# Patient Record
Sex: Male | Born: 1960 | Race: Black or African American | Hispanic: No | Marital: Married | Smoking: Former smoker
Health system: Southern US, Community
[De-identification: ages and names within clinical notes are randomized; demographics above are authoritative.]

## PROBLEM LIST (undated history)

## (undated) DIAGNOSIS — E119 Type 2 diabetes mellitus without complications: Secondary | ICD-10-CM

## (undated) DIAGNOSIS — S82891A Other fracture of right lower leg, initial encounter for closed fracture: Secondary | ICD-10-CM

## (undated) DIAGNOSIS — E7800 Pure hypercholesterolemia, unspecified: Secondary | ICD-10-CM

## (undated) DIAGNOSIS — I10 Essential (primary) hypertension: Secondary | ICD-10-CM

---

## 1998-12-28 ENCOUNTER — Encounter: Payer: Self-pay | Admitting: Emergency Medicine

## 1998-12-28 ENCOUNTER — Emergency Department (HOSPITAL_COMMUNITY): Admission: EM | Admit: 1998-12-28 | Discharge: 1998-12-28 | Payer: Self-pay | Admitting: Emergency Medicine

## 2004-08-10 ENCOUNTER — Ambulatory Visit: Payer: Self-pay | Admitting: Internal Medicine

## 2005-06-30 ENCOUNTER — Ambulatory Visit: Payer: Self-pay | Admitting: Internal Medicine

## 2005-08-11 ENCOUNTER — Ambulatory Visit: Payer: Self-pay | Admitting: Internal Medicine

## 2005-09-22 ENCOUNTER — Ambulatory Visit: Payer: Self-pay | Admitting: Internal Medicine

## 2006-08-03 ENCOUNTER — Encounter: Admission: RE | Admit: 2006-08-03 | Discharge: 2006-08-03 | Payer: Self-pay | Admitting: Gastroenterology

## 2007-05-21 DIAGNOSIS — I10 Essential (primary) hypertension: Secondary | ICD-10-CM | POA: Insufficient documentation

## 2013-03-15 ENCOUNTER — Other Ambulatory Visit: Payer: Self-pay | Admitting: Family Medicine

## 2013-03-15 ENCOUNTER — Ambulatory Visit
Admission: RE | Admit: 2013-03-15 | Discharge: 2013-03-15 | Disposition: A | Discharge status: Home / Self Care | Payer: BC Managed Care – PPO | Source: Ambulatory Visit | Attending: Family Medicine | Admitting: Family Medicine

## 2013-03-15 DIAGNOSIS — R05 Cough: Secondary | ICD-10-CM

## 2013-03-15 DIAGNOSIS — R059 Cough, unspecified: Secondary | ICD-10-CM

## 2013-12-14 ENCOUNTER — Encounter (HOSPITAL_COMMUNITY): Payer: Self-pay | Admitting: Emergency Medicine

## 2013-12-14 ENCOUNTER — Observation Stay (HOSPITAL_COMMUNITY)
Admission: EM | Admit: 2013-12-14 | Discharge: 2013-12-15 | Disposition: A | Discharge status: Home / Self Care | Payer: BC Managed Care – PPO | Attending: Internal Medicine | Admitting: Internal Medicine

## 2013-12-14 ENCOUNTER — Emergency Department (HOSPITAL_COMMUNITY): Payer: BC Managed Care – PPO

## 2013-12-14 DIAGNOSIS — I6529 Occlusion and stenosis of unspecified carotid artery: Secondary | ICD-10-CM | POA: Insufficient documentation

## 2013-12-14 DIAGNOSIS — R918 Other nonspecific abnormal finding of lung field: Secondary | ICD-10-CM | POA: Insufficient documentation

## 2013-12-14 DIAGNOSIS — I2510 Atherosclerotic heart disease of native coronary artery without angina pectoris: Secondary | ICD-10-CM | POA: Insufficient documentation

## 2013-12-14 DIAGNOSIS — I771 Stricture of artery: Secondary | ICD-10-CM | POA: Insufficient documentation

## 2013-12-14 DIAGNOSIS — Z87891 Personal history of nicotine dependence: Secondary | ICD-10-CM | POA: Insufficient documentation

## 2013-12-14 DIAGNOSIS — E871 Hypo-osmolality and hyponatremia: Secondary | ICD-10-CM | POA: Insufficient documentation

## 2013-12-14 DIAGNOSIS — E049 Nontoxic goiter, unspecified: Secondary | ICD-10-CM

## 2013-12-14 DIAGNOSIS — E7800 Pure hypercholesterolemia, unspecified: Secondary | ICD-10-CM | POA: Insufficient documentation

## 2013-12-14 DIAGNOSIS — R410 Disorientation, unspecified: Secondary | ICD-10-CM | POA: Diagnosis present

## 2013-12-14 DIAGNOSIS — E785 Hyperlipidemia, unspecified: Secondary | ICD-10-CM | POA: Insufficient documentation

## 2013-12-14 DIAGNOSIS — E119 Type 2 diabetes mellitus without complications: Secondary | ICD-10-CM | POA: Insufficient documentation

## 2013-12-14 DIAGNOSIS — I10 Essential (primary) hypertension: Secondary | ICD-10-CM

## 2013-12-14 DIAGNOSIS — Z79899 Other long term (current) drug therapy: Secondary | ICD-10-CM | POA: Insufficient documentation

## 2013-12-14 DIAGNOSIS — K7689 Other specified diseases of liver: Secondary | ICD-10-CM | POA: Insufficient documentation

## 2013-12-14 DIAGNOSIS — G9340 Encephalopathy, unspecified: Secondary | ICD-10-CM | POA: Insufficient documentation

## 2013-12-14 DIAGNOSIS — E876 Hypokalemia: Secondary | ICD-10-CM

## 2013-12-14 DIAGNOSIS — K219 Gastro-esophageal reflux disease without esophagitis: Secondary | ICD-10-CM | POA: Insufficient documentation

## 2013-12-14 DIAGNOSIS — Z8781 Personal history of (healed) traumatic fracture: Secondary | ICD-10-CM | POA: Insufficient documentation

## 2013-12-14 DIAGNOSIS — R4182 Altered mental status, unspecified: Secondary | ICD-10-CM

## 2013-12-14 DIAGNOSIS — R079 Chest pain, unspecified: Principal | ICD-10-CM | POA: Insufficient documentation

## 2013-12-14 DIAGNOSIS — F29 Unspecified psychosis not due to a substance or known physiological condition: Secondary | ICD-10-CM | POA: Insufficient documentation

## 2013-12-14 DIAGNOSIS — E041 Nontoxic single thyroid nodule: Secondary | ICD-10-CM | POA: Insufficient documentation

## 2013-12-14 HISTORY — DX: Essential (primary) hypertension: I10

## 2013-12-14 HISTORY — DX: Other fracture of right lower leg, initial encounter for closed fracture: S82.891A

## 2013-12-14 HISTORY — DX: Type 2 diabetes mellitus without complications: E11.9

## 2013-12-14 HISTORY — DX: Pure hypercholesterolemia, unspecified: E78.00

## 2013-12-14 LAB — I-STAT CHEM 8, ED
BUN: 7 mg/dL (ref 6–23)
CALCIUM ION: 1.09 mmol/L — AB (ref 1.12–1.23)
CREATININE: 1 mg/dL (ref 0.50–1.35)
Chloride: 85 mEq/L — ABNORMAL LOW (ref 96–112)
GLUCOSE: 197 mg/dL — AB (ref 70–99)
HCT: 45 % (ref 39.0–52.0)
Hemoglobin: 15.3 g/dL (ref 13.0–17.0)
Potassium: 3 mEq/L — ABNORMAL LOW (ref 3.7–5.3)
Sodium: 122 mEq/L — ABNORMAL LOW (ref 137–147)
TCO2: 23 mmol/L (ref 0–100)

## 2013-12-14 LAB — RAPID URINE DRUG SCREEN, HOSP PERFORMED
Amphetamines: NOT DETECTED
BARBITURATES: NOT DETECTED
BENZODIAZEPINES: NOT DETECTED
COCAINE: NOT DETECTED
Opiates: NOT DETECTED
TETRAHYDROCANNABINOL: NOT DETECTED

## 2013-12-14 LAB — COMPREHENSIVE METABOLIC PANEL
ALT: 43 U/L (ref 0–53)
AST: 42 U/L — ABNORMAL HIGH (ref 0–37)
Albumin: 4.3 g/dL (ref 3.5–5.2)
Alkaline Phosphatase: 49 U/L (ref 39–117)
BUN: 9 mg/dL (ref 6–23)
CALCIUM: 9.8 mg/dL (ref 8.4–10.5)
CO2: 22 meq/L (ref 19–32)
Chloride: 80 mEq/L — ABNORMAL LOW (ref 96–112)
Creatinine, Ser: 0.82 mg/dL (ref 0.50–1.35)
GLUCOSE: 202 mg/dL — AB (ref 70–99)
Potassium: 3.2 mEq/L — ABNORMAL LOW (ref 3.7–5.3)
Sodium: 122 mEq/L — ABNORMAL LOW (ref 137–147)
Total Bilirubin: 1 mg/dL (ref 0.3–1.2)
Total Protein: 7.3 g/dL (ref 6.0–8.3)

## 2013-12-14 LAB — URINALYSIS, ROUTINE W REFLEX MICROSCOPIC
BILIRUBIN URINE: NEGATIVE
Glucose, UA: 250 mg/dL — AB
KETONES UR: NEGATIVE mg/dL
Leukocytes, UA: NEGATIVE
Nitrite: NEGATIVE
PH: 6 (ref 5.0–8.0)
Protein, ur: NEGATIVE mg/dL
Specific Gravity, Urine: 1.01 (ref 1.005–1.030)
Urobilinogen, UA: 0.2 mg/dL (ref 0.0–1.0)

## 2013-12-14 LAB — I-STAT TROPONIN, ED: Troponin i, poc: 0 ng/mL (ref 0.00–0.08)

## 2013-12-14 LAB — CBC
HEMATOCRIT: 40.3 % (ref 39.0–52.0)
Hemoglobin: 14.2 g/dL (ref 13.0–17.0)
MCH: 30.2 pg (ref 26.0–34.0)
MCHC: 35.2 g/dL (ref 30.0–36.0)
MCV: 85.7 fL (ref 78.0–100.0)
Platelets: 261 10*3/uL (ref 150–400)
RBC: 4.7 MIL/uL (ref 4.22–5.81)
RDW: 12.3 % (ref 11.5–15.5)
WBC: 10.2 10*3/uL (ref 4.0–10.5)

## 2013-12-14 LAB — TROPONIN I: Troponin I: <0.3 ng/mL (ref <0.30)

## 2013-12-14 LAB — CBG MONITORING, ED: GLUCOSE-CAPILLARY: 181 mg/dL — AB (ref 70–99)

## 2013-12-14 LAB — PROTIME-INR
INR: 0.94 (ref 0.00–1.49)
PROTHROMBIN TIME: 12.4 s (ref 11.6–15.2)

## 2013-12-14 LAB — URINE MICROSCOPIC-ADD ON

## 2013-12-14 LAB — ETHANOL: Alcohol, Ethyl (B): 37 mg/dL — ABNORMAL HIGH (ref 0–11)

## 2013-12-14 LAB — PRO B NATRIURETIC PEPTIDE: Pro B Natriuretic peptide (BNP): 13 pg/mL (ref 0–125)

## 2013-12-14 MED ORDER — IOHEXOL 350 MG/ML SOLN
125.0000 mL | Freq: Once | INTRAVENOUS | Status: AC | PRN
  Administered 2013-12-14: 125 mL via INTRAVENOUS

## 2013-12-14 MED ORDER — MORPHINE SULFATE 2 MG/ML IJ SOLN
2.0000 mg | Freq: Once | INTRAMUSCULAR | Status: AC
  Administered 2013-12-14: 2 mg via INTRAVENOUS
  Filled 2013-12-14: qty 1

## 2013-12-14 MED ORDER — POTASSIUM CHLORIDE CRYS ER 20 MEQ PO TBCR
40.0000 meq | EXTENDED_RELEASE_TABLET | Freq: Once | ORAL | Status: AC
  Administered 2013-12-14: 40 meq via ORAL
  Filled 2013-12-14: qty 2

## 2013-12-14 MED ORDER — SODIUM CHLORIDE 0.9 % IV BOLUS (SEPSIS)
1000.0000 mL | Freq: Once | INTRAVENOUS | Status: AC
  Administered 2013-12-14: 1000 mL via INTRAVENOUS

## 2013-12-14 MED ORDER — POTASSIUM CHLORIDE 10 MEQ/100ML IV SOLN
10.0000 meq | Freq: Once | INTRAVENOUS | Status: AC
  Administered 2013-12-14: 10 meq via INTRAVENOUS
  Filled 2013-12-14: qty 100

## 2013-12-14 MED ORDER — ASPIRIN 81 MG PO CHEW
324.0000 mg | CHEWABLE_TABLET | Freq: Once | ORAL | Status: AC
  Administered 2013-12-14: 324 mg via ORAL
  Filled 2013-12-14: qty 4

## 2013-12-14 MED ORDER — NITROGLYCERIN 0.4 MG SL SUBL: 0.4000 mg | SUBLINGUAL_TABLET | SUBLINGUAL | Status: DC | PRN

## 2013-12-14 NOTE — ED Provider Notes (Signed)
CSN: [id number]     Arrival date & time 12/14/13  1946 History   None    Chief Complaint  Patient presents with  . Chest Pain  . Altered Mental Status     (Consider location/radiation/quality/duration/timing/severity/associated sxs/prior Treatment) HPI Comments: Per family, pt was at home, confused, began complaining of chest pain, was diaphoretic, fidgety, had slurred speech . They gave him some snakcs thinking his glucose was low, but pt did not improved and continued to complain of chest pain.   Patient is a 53 y.o. male presenting with altered mental status. The history is provided by the patient. The history is limited by the condition of the patient. No language interpreter was used.  Altered Mental Status Presenting symptoms: confusion   Severity:  Moderate Most recent episode:  Today Episode history:  Single Duration:  4 hours Timing:  Constant Progression:  Waxing and waning Chronicity:  New Context: alcohol use   Context: not dementia, not head injury, not a recent illness and not a recent infection   Associated symptoms: headaches, nausea and visual change   Associated symptoms: no abdominal pain, no agitation, no fever, no rash, no vomiting and no weakness   Associated symptoms comment:  Chest pain, diaphoresis, aggitation   Past Medical History  Diagnosis Date  . Diabetes mellitus without complication   . Hypertension   . High cholesterol   . Ankle fracture, right     steel plate   History reviewed. No pertinent past surgical history. No family history on file. History  Substance Use Topics  . Smoking status: Current Every Day Smoker -- 0.50 packs/day    Types: Cigarettes  . Smokeless tobacco: Never Used  . Alcohol Use: 1.8 oz/week    3 Cans of beer per week    Review of Systems  Constitutional: Positive for diaphoresis and activity change. Negative for fever, appetite change and fatigue.  HENT: Negative for congestion, facial swelling, rhinorrhea and  trouble swallowing.   Eyes: Negative for photophobia and pain.  Respiratory: Negative for cough, chest tightness and shortness of breath.   Cardiovascular: Positive for chest pain. Negative for leg swelling.  Gastrointestinal: Positive for nausea. Negative for vomiting, abdominal pain, diarrhea and constipation.  Endocrine: Negative for polydipsia and polyuria.  Genitourinary: Negative for dysuria, urgency, decreased urine volume and difficulty urinating.  Musculoskeletal: Negative for back pain and gait problem.  Skin: Negative for color change, rash and wound.  Allergic/Immunologic: Negative for immunocompromised state.  Neurological: Positive for headaches. Negative for dizziness, facial asymmetry, speech difficulty, weakness and numbness.  Psychiatric/Behavioral: Positive for confusion. Negative for decreased concentration and agitation. The patient is nervous/anxious.       Allergies  Review of patient's allergies indicates no known allergies.  Home Medications   Current Outpatient Rx  Name  Route  Sig  Dispense  Refill  . atorvastatin (LIPITOR) 10 MG tablet   Oral   Take 10 mg by mouth daily.         . benazepril-hydrochlorthiazide (LOTENSIN HCT) 10-12.5 MG per tablet   Oral   Take 1 tablet by mouth daily.         . metFORMIN (GLUCOPHAGE-XR) 500 MG 24 hr tablet   Oral   Take 1 tablet by mouth 2 (two) times daily.           BP 143/77  Pulse 101  Temp(Src) 98.2 F (36.8 C) (Oral)  Resp 18  Ht 5\' 4"  (1.626 m)  Wt 193 lb (87.544 kg)  BMI 33.11 kg/m2  SpO2 99% Physical Exam  Constitutional: He appears well-developed and well-nourished. No distress.  HENT:  Head: Normocephalic and atraumatic.  Mouth/Throat: No oropharyngeal exudate.  Eyes: Pupils are equal, round, and reactive to light.  Neck: Normal range of motion. Neck supple.  Cardiovascular: Regular rhythm and normal heart sounds.  Tachycardia present.  Exam reveals no gallop and no friction rub.   No  murmur heard. Pulmonary/Chest: Effort normal and breath sounds normal. No respiratory distress. He has no wheezes. He has no rales.  Abdominal: Soft. Bowel sounds are normal. He exhibits no distension and no mass. There is no tenderness. There is no rebound and no guarding.  Musculoskeletal: Normal range of motion. He exhibits no edema and no tenderness.  Neurological: He is alert. He has normal strength. He displays no tremor. No cranial nerve deficit or sensory deficit. He exhibits normal muscle tone. Coordination normal. GCS eye subscore is 4. GCS verbal subscore is 4. GCS motor subscore is 6.  Skin: Skin is warm and dry.  Psychiatric: His mood appears anxious. Cognition and memory are impaired.    ED Course  Procedures (including critical care time) Labs Review Labs Reviewed  COMPREHENSIVE METABOLIC PANEL - Abnormal; Notable for the following:    Sodium 122 (*)    Potassium 3.2 (*)    Chloride 80 (*)    Glucose, Bld 202 (*)    AST 42 (*)    All other components within normal limits  ETHANOL - Abnormal; Notable for the following:    Alcohol, Ethyl (B) 37 (*)    All other components within normal limits  URINALYSIS, ROUTINE W REFLEX MICROSCOPIC - Abnormal; Notable for the following:    Glucose, UA 250 (*)    Hgb urine dipstick LARGE (*)    All other components within normal limits  I-STAT CHEM 8, ED - Abnormal; Notable for the following:    Sodium 122 (*)    Potassium 3.0 (*)    Chloride 85 (*)    Glucose, Bld 197 (*)    Calcium, Ion 1.09 (*)    All other components within normal limits  CBG MONITORING, ED - Abnormal; Notable for the following:    Glucose-Capillary 181 (*)    All other components within normal limits  CBC  PRO B NATRIURETIC PEPTIDE  PROTIME-INR  TROPONIN I  URINE RAPID DRUG SCREEN (HOSP PERFORMED)  URINE MICROSCOPIC-ADD ON  TROPONIN I  TROPONIN I  TROPONIN I  TROPONIN I  I-STAT TROPOININ, ED   Imaging Review Ct Angio Head W/cm &/or Wo  Cm  12/14/2013   CLINICAL DATA:  Abnormal chest x-ray, evaluate for dissection  EXAM: CT ANGIOGRAPHY HEAD AND NECK  TECHNIQUE: Multidetector CT imaging of the head and neck was performed using the standard protocol during bolus administration of intravenous contrast. Multiplanar CT image reconstructions and MIPs were obtained to evaluate the vascular anatomy. Carotid stenosis measurements (when applicable) are obtained utilizing NASCET criteria, using the distal internal carotid diameter as the denominator.  CONTRAST:  OMNIPAQUE IOHEXOL 350 MG/ML SOLN  COMPARISON:  Prior radiograph performed earlier on the same day.  FINDINGS: CTA HEAD FINDINGS  The noncontrast images of the brain demonstrate no evidence of acute intracranial hemorrhage or large vessel territory infarct. No mass lesion or midline shift. Cerebral volume within normal limits for patient age. No extra-axial fluid collection.  Calvarium is intact. Scalp soft tissues are normal. Orbits within normal limits.  Paranasal sinuses and mastoid air cells are  clear.  Anterior circulation: Normal appearance of the distal cervical and petrous segments of the internal carotid arteries bilaterally. There is tortuosity of the cavernous portion of the right internal carotid artery without associated high-grade flow-limiting stenosis. Atherosclerotic calcifications cm within the cavernous left ICA with approximately 50% of short segment stenosis. Minimal atherosclerotic plaque noted within the cavernous right ICA without associated stenosis. The supra clinoid internal carotid arteries are within normal limits bilaterally. Widely patent anterior communicating artery. Normal appearance of the anterior and middle cerebral arteries.  Posterior circulation: Right vertebral artery is dominant, with normal appearance of the vertebral arteries, vertebrobasilar junction and basilar artery, as well as main branch vessels. Small bilateral posterior communicating arteries  are present. Normal appearance of posterior cerebral arteries.  No large vessel occlusion, hemodynamically significant stenosis, dissection, luminal irregularity, contrast extravasation or aneurysm within the anterior nor posterior circulation.  Review of the MIP images confirms the above findings.  CTA NECK FINDINGS  The examination is technically limited by motion artifact.  The visualized aortic arch is within normal limits with normal 3 vessel morphology. No stenosis seen at the origin of the great vessels. The subclavian arteries are widely patent and normal in caliber bilaterally.  The common carotid arteries are well opacified and of normal caliber without evidence of dissection or high-grade flow-limiting stenosis. Minimal atherosclerotic plaque noted about the right carotid bifurcation and proximal right internal carotid artery. The internal carotid arteries are well opacified bilaterally without evidence of dissection or high-grade flow-limiting stenosis. Tortuosity of the distal right internal carotid artery noted.  The external carotid arteries and their branches are within normal limits.  The vertebral arteries both arise from the subclavian arteries. The right vertebral artery is dominant. Vertebral arteries are well opacified along their entire course without evidence of dissection, occlusion, or other focal abnormality.  Review of the MIP images confirms the above findings.  IMPRESSION: CTA HEAD:  1. Atherosclerotic plaque with associated short segment stenosis of approximately 50% within the cavernous left internal carotid artery. 2. Otherwise unremarkable CTA of the brain without evidence of high-grade flow-limiting stenosis, proximal branch occlusion, or intracranial aneurysm.  CTA NECK:  No CT evidence of dissection or high-grade flow-limiting stenosis identified within the neck.   Electronically Signed   By: Rise Mu M.D.   On: 12/14/2013 23:40   Ct Angio Neck W/cm &/or  Wo/cm  12/14/2013   CLINICAL DATA:  Abnormal chest x-ray, evaluate for dissection  EXAM: CT ANGIOGRAPHY HEAD AND NECK  TECHNIQUE: Multidetector CT imaging of the head and neck was performed using the standard protocol during bolus administration of intravenous contrast. Multiplanar CT image reconstructions and MIPs were obtained to evaluate the vascular anatomy. Carotid stenosis measurements (when applicable) are obtained utilizing NASCET criteria, using the distal internal carotid diameter as the denominator.  CONTRAST:  OMNIPAQUE IOHEXOL 350 MG/ML SOLN  COMPARISON:  Prior radiograph performed earlier on the same day.  FINDINGS: CTA HEAD FINDINGS  The noncontrast images of the brain demonstrate no evidence of acute intracranial hemorrhage or large vessel territory infarct. No mass lesion or midline shift. Cerebral volume within normal limits for patient age. No extra-axial fluid collection.  Calvarium is intact. Scalp soft tissues are normal. Orbits within normal limits.  Paranasal sinuses and mastoid air cells are clear.  Anterior circulation: Normal appearance of the distal cervical and petrous segments of the internal carotid arteries bilaterally. There is tortuosity of the cavernous portion of the right internal carotid artery without associated high-grade  flow-limiting stenosis. Atherosclerotic calcifications cm within the cavernous left ICA with approximately 50% of short segment stenosis. Minimal atherosclerotic plaque noted within the cavernous right ICA without associated stenosis. The supra clinoid internal carotid arteries are within normal limits bilaterally. Widely patent anterior communicating artery. Normal appearance of the anterior and middle cerebral arteries.  Posterior circulation: Right vertebral artery is dominant, with normal appearance of the vertebral arteries, vertebrobasilar junction and basilar artery, as well as main branch vessels. Small bilateral posterior communicating  arteries are present. Normal appearance of posterior cerebral arteries.  No large vessel occlusion, hemodynamically significant stenosis, dissection, luminal irregularity, contrast extravasation or aneurysm within the anterior nor posterior circulation.  Review of the MIP images confirms the above findings.  CTA NECK FINDINGS  The examination is technically limited by motion artifact.  The visualized aortic arch is within normal limits with normal 3 vessel morphology. No stenosis seen at the origin of the great vessels. The subclavian arteries are widely patent and normal in caliber bilaterally.  The common carotid arteries are well opacified and of normal caliber without evidence of dissection or high-grade flow-limiting stenosis. Minimal atherosclerotic plaque noted about the right carotid bifurcation and proximal right internal carotid artery. The internal carotid arteries are well opacified bilaterally without evidence of dissection or high-grade flow-limiting stenosis. Tortuosity of the distal right internal carotid artery noted.  The external carotid arteries and their branches are within normal limits.  The vertebral arteries both arise from the subclavian arteries. The right vertebral artery is dominant. Vertebral arteries are well opacified along their entire course without evidence of dissection, occlusion, or other focal abnormality.  Review of the MIP images confirms the above findings.  IMPRESSION: CTA HEAD:  1. Atherosclerotic plaque with associated short segment stenosis of approximately 50% within the cavernous left internal carotid artery. 2. Otherwise unremarkable CTA of the brain without evidence of high-grade flow-limiting stenosis, proximal branch occlusion, or intracranial aneurysm.  CTA NECK:  No CT evidence of dissection or high-grade flow-limiting stenosis identified within the neck.   Electronically Signed   By: Rise Mu M.D.   On: 12/14/2013 23:40   Ct Chest W  Contrast  12/14/2013   CLINICAL DATA:  Unexplained chest pain. Acute mental status changes.  EXAM: CT CHEST WITH CONTRAST  TECHNIQUE: Multidetector CT imaging of the chest was performed during intravenous contrast administration.  CONTRAST:  OMNIPAQUE IOHEXOL 350 MG/ML IV. This was also administered for the CT angio head and neck which was obtained simultaneously and is reported separately.  COMPARISON:  DG CHEST 1V PORT dated 12/14/2013; DG CHEST 2 VIEW dated 03/15/2013; DG CHEST 2V dated 03/15/2011  FINDINGS: Examination degraded by respiratory motion throughout. Normal heart size. Mild to moderate right coronary atherosclerosis. No pericardial effusion. No visible atherosclerosis involving the thoracic or upper abdominal aorta or their visualized branches.  Pulmonary parenchyma clear without localized airspace consolidation, interstitial disease, or parenchymal nodules or masses. Central airways patent without significant bronchial wall thickening.  Thickening of the wall of the distal esophagus without evidence of hiatal hernia. No significant mediastinal, hilar, or axillary lymphadenopathy. Diffuse thyroid gland enlargement with an approximate 1.8 x 1.5 cm nodule in the lower pole of the left lobe of the gland.  Focal hepatic steatosis adjacent to the falciform ligament. Stomach mildly distended with fluid. Visualized upper abdomen otherwise unremarkable. Bone window images unremarkable.  IMPRESSION: 1. Respiratory motion degraded examination demonstrates thickening of the wall of the distal esophagus without evidence of hiatal hernia, coronary esophagitis  and/or the sequela of GE reflux disease. 2. Mild-to-moderate right coronary artery atherosclerosis. 3.  No acute cardiopulmonary disease. 4. Goiter with an approximate 1.8 x 1.5 cm nodule in the lower pole of the left lobe of the thyroid gland. Non-emergent palpation thyroid ultrasound is indicated. This follows ACR consensus guidelines: Managing  Incidental Thyroid Nodules Detected on Imaging: White Paper of the ACR Incidental Thyroid Findings Committee. J Am Coll Radiol 2015; 12:143-150.   Electronically Signed   By: Hulan Saashomas  Lawrence M.D.   On: 12/14/2013 23:28   Dg Chest Portable 1 View  12/14/2013   CLINICAL DATA:  53 year old male with chest pain.  EXAM: PORTABLE CHEST - 1 VIEW  COMPARISON:  03/23/2013  FINDINGS: Rounded prominence of the right suprahilar mediastinum is noted.  This is a low volume film.  Mild peribronchial thickening again identified.  There is no evidence of focal airspace disease, pulmonary edema, pleural effusion, or pneumothorax. No acute bony abnormalities are identified.  IMPRESSION: Right mediastinal prominence. Consider PA and lateral chest radiographs for initial evaluation versus chest CT with contrast.  No other significant abnormalities identified.   Electronically Signed   By: Laveda AbbeJeff  Hu M.D.   On: 12/14/2013 20:40     EKG Interpretation None      Date: 12/15/2013 @ 19:52  Rate: 115  Rhythm: sinus tachycardia  QRS Axis: normal  Intervals: normal  ST/T Wave abnormalities: nonspecific ST changes,  Conduction Disutrbances:none  Narrative Interpretation: upsloping ST segments in inferior and precordial leads w/o reciprocal changes.   Old EKG Reviewed: none available   Date: 12/15/2013 @ 19:56  Rate: 115  Rhythm: sinus tachycardia  QRS Axis: normal  Intervals: normal  ST/T Wave abnormalities: nonspecific ST changes,   Conduction Disutrbances:none  Narrative Interpretation: upsloping ST segments in inferior and precordial leads w/o reciprocal changes.   Old EKG Reviewed: No changes   Date: 12/15/2013 @ 21:38  Rate: 105  Rhythm: sinus tachycardia  QRS Axis: normal  Intervals: normal  ST/T Wave abnormalities: nonspecific ST changes,   Conduction Disutrbances:none  Narrative Interpretation: upsloping ST segments in inferior and precordial leads w/o reciprocal changes.   Old EKG Reviewed: No  changes    MDM   Final diagnoses:  Chest pain  Altered mental status  Goiter  Hyponatremia  Hypokalemia    Pt is a 53 y.o. male with Pmhx as above who presents with CP, SOB, nausea, confusion, diaphoresis beginning around 4pm today.  CP had resolved just prior to by exam.  Pt is confused, states he feels strange, but has difficulty describing. He denies drug use, states he drank 4-5 beers today which he does not normally do. He denies fever, cough, ab pain, vomiting, diarrhea.  On PE, pt is tachycardic, VS otherwise stable. He has reproducible chest wall pain. Cardiopulm exam otherwise benign. GCS 14 (cannot give month and has frequent confusion w/ questioning), but no focal neuro findings.  EKG w/ upsloping ST segments in inferior and precordial leads, but no elevation, no reciprocal changes. I spoke w/ Dr. Excell Seltzerooper of cardiology who does not believe he meets STEMI criteria, rec repeat EKGs to look for dynamic changes.     Chest pain returned, but pt had no EKG changes. CXR w/ abnl R mediastinal border.  Given the combination of chest pain, AMS, and abnormal mediastinum on CXR Dissection study ordered.  Na 122,  K 3.2 which I suspect is due to chronic alcohol abuse, though pt denies.    12:02 AM No acute findings  on CTs, though does have atherosclerotic plaque with associated short segment stenosis of approximately 50% within the cavernous left internal carotid artery, mild-to-moderate right coronary artery atherosclerosis, and goiter.  Delta trop negative. Triage will admit.        Shanna Cisco, MD 12/15/13 [phone]

## 2013-12-14 NOTE — ED Notes (Signed)
Pt's wife states that he called her at noon today asking her to come home. Pt was confused. Later, around 1800, the pt began c/o heartburn and chest pain. Pt is confused upon examination and unable to clearly answer questions. Pt is also anxious. Pt did admit to drinking 4-5 beers today and states this is not normal for him.

## 2013-12-15 DIAGNOSIS — F29 Unspecified psychosis not due to a substance or known physiological condition: Secondary | ICD-10-CM

## 2013-12-15 DIAGNOSIS — I10 Essential (primary) hypertension: Secondary | ICD-10-CM

## 2013-12-15 DIAGNOSIS — R410 Disorientation, unspecified: Secondary | ICD-10-CM | POA: Diagnosis present

## 2013-12-15 DIAGNOSIS — R4182 Altered mental status, unspecified: Secondary | ICD-10-CM

## 2013-12-15 DIAGNOSIS — E871 Hypo-osmolality and hyponatremia: Secondary | ICD-10-CM | POA: Diagnosis present

## 2013-12-15 DIAGNOSIS — R079 Chest pain, unspecified: Secondary | ICD-10-CM | POA: Diagnosis present

## 2013-12-15 LAB — NA AND K (SODIUM & POTASSIUM), RAND UR
POTASSIUM UR: 10 meq/L
SODIUM UR: 23 meq/L

## 2013-12-15 LAB — BASIC METABOLIC PANEL
BUN: 8 mg/dL (ref 6–23)
CO2: 27 mEq/L (ref 19–32)
Calcium: 9.4 mg/dL (ref 8.4–10.5)
Chloride: 102 mEq/L (ref 96–112)
Creatinine, Ser: 0.83 mg/dL (ref 0.50–1.35)
GFR calc non Af Amer: >90 mL/min (ref >90)
Glucose, Bld: 149 mg/dL — ABNORMAL HIGH (ref 70–99)
POTASSIUM: 4.4 meq/L (ref 3.7–5.3)
SODIUM: 140 meq/L (ref 137–147)

## 2013-12-15 LAB — TROPONIN I: Troponin I: <0.3 ng/mL (ref <0.30)

## 2013-12-15 LAB — GLUCOSE, CAPILLARY: GLUCOSE-CAPILLARY: 216 mg/dL — AB (ref 70–99)

## 2013-12-15 MED ORDER — PANTOPRAZOLE SODIUM 40 MG PO TBEC: 40.0000 mg | DELAYED_RELEASE_TABLET | Freq: Every day | ORAL | Status: AC

## 2013-12-15 MED ORDER — ONDANSETRON HCL 4 MG/2ML IJ SOLN: 4.0000 mg | Freq: Four times a day (QID) | INTRAMUSCULAR | Status: DC | PRN

## 2013-12-15 MED ORDER — HEPARIN SODIUM (PORCINE) 5000 UNIT/ML IJ SOLN
5000.0000 [IU] | Freq: Three times a day (TID) | INTRAMUSCULAR | Status: DC
  Administered 2013-12-15: 5000 [IU] via SUBCUTANEOUS
  Filled 2013-12-15 (×4): qty 1

## 2013-12-15 MED ORDER — METFORMIN HCL ER 500 MG PO TB24
500.0000 mg | ORAL_TABLET | Freq: Two times a day (BID) | ORAL | Status: DC
  Filled 2013-12-15 (×3): qty 1

## 2013-12-15 MED ORDER — ASPIRIN EC 325 MG PO TBEC: 325.0000 mg | DELAYED_RELEASE_TABLET | Freq: Every day | ORAL | Status: AC

## 2013-12-15 MED ORDER — SODIUM CHLORIDE 0.9 % IV SOLN
INTRAVENOUS | Status: AC
  Administered 2013-12-15: 02:00:00 via INTRAVENOUS

## 2013-12-15 MED ORDER — ATORVASTATIN CALCIUM 10 MG PO TABS
10.0000 mg | ORAL_TABLET | Freq: Every day | ORAL | Status: DC
  Filled 2013-12-15: qty 1

## 2013-12-15 MED ORDER — MORPHINE SULFATE 4 MG/ML IJ SOLN
4.0000 mg | Freq: Once | INTRAMUSCULAR | Status: AC
  Administered 2013-12-15: 4 mg via INTRAVENOUS
  Filled 2013-12-15: qty 1

## 2013-12-15 MED ORDER — ALUM & MAG HYDROXIDE-SIMETH 200-200-20 MG/5ML PO SUSP
30.0000 mL | Freq: Once | ORAL | Status: AC
  Administered 2013-12-15: 30 mL via ORAL
  Filled 2013-12-15: qty 30

## 2013-12-15 MED ORDER — BENAZEPRIL HCL 10 MG PO TABS
10.0000 mg | ORAL_TABLET | Freq: Every day | ORAL | Status: DC
  Filled 2013-12-15: qty 1

## 2013-12-15 MED ORDER — BENAZEPRIL HCL 20 MG PO TABS: 20.0000 mg | ORAL_TABLET | Freq: Every day | ORAL | Status: AC

## 2013-12-15 MED ORDER — ACETAMINOPHEN 325 MG PO TABS: 650.0000 mg | ORAL_TABLET | ORAL | Status: DC | PRN

## 2013-12-15 NOTE — Progress Notes (Signed)
53 year old black male with a history of hypertension, dyslipidemia, diabetes and tobacco use admitted earlier today by Dr. Julian ReilGardner for altered mental status, chest pain. Currently significantly better, mental status back to usual baseline, no further chest pain since admission. Is requesting that he be discharged home. Have offered a stress test to be done when the patient is in the hospital, however he declines and wants to pursue this in the outpatient setting. Both he and his spouse claimed that he will followup with her primary patient of Dr. Merri Brunetteandace Smith for further outpatient workup including a stress test. Case was briefly discussed on the phone with cardiology on call Dr. Teressa LowerBensimohn who fell that the patient could undergo outpatient stress test amount he also reviewed the CT scan of the chest done earlier today and thought that there was calcifications on the right coronary artery rather than atherosclerotic lesions. Patient is being discharged home in a stable condition.

## 2013-12-15 NOTE — H&P (Signed)
[facility] History and Physical  Christopher Tyler ZOX:[id number] DOB: 1961-04-29 DOA: 12/14/2013  Referring physician: EDP PCP: No primary provider on file.   Chief Complaint: Chest pain   HPI: Christopher Tyler is a 53 y.o. male who presents to the ED with CP and AMS per family.  CP onset at about 4pm this afternoon.  Located in L chest, no radiation, associated with diaphoresis.  His AMS has also been going on throughout the afternoon.  Patient admits to drinking a 6 pack of beer earlier today which is not normal for him.  When family saw the patient this afternoon, because he had slurred speech they tried giving him some snacks thinking his BGL was low, this did not help.  They also made him drink   Review of Systems: Systems reviewed.  As above, otherwise negative  Past Medical History  Diagnosis Date  . Diabetes mellitus without complication   . Hypertension   . High cholesterol   . Ankle fracture, right     steel plate   History reviewed. No pertinent past surgical history. Social History:  reports that he has been smoking Cigarettes.  He has been smoking about 0.50 packs per day. He has never used smokeless tobacco. He reports that he drinks about 1.8 ounces of alcohol per week. He reports that he does not use illicit drugs.  No Known Allergies  No family history on file.   Prior to Admission medications   Medication Sig Start Date End Date Taking? Authorizing Provider  atorvastatin (LIPITOR) 10 MG tablet Take 10 mg by mouth daily.   Yes Historical Provider, MD  benazepril-hydrochlorthiazide (LOTENSIN HCT) 10-12.5 MG per tablet Take 1 tablet by mouth daily. 11/27/13  Yes Historical Provider, MD  metFORMIN (GLUCOPHAGE-XR) 500 MG 24 hr tablet Take 1 tablet by mouth 2 (two) times daily.  11/27/13  Yes Historical Provider, MD   Physical Exam: Filed Vitals:   12/14/13 2357  BP: 143/77  Pulse: 101  Temp: 98.2 F (36.8 C)  Resp: 18    BP 143/77  Pulse 101  Temp(Src) 98.2 F  (36.8 C) (Oral)  Resp 18  Ht 5\' 4"  (1.626 m)  Wt 87.544 kg (193 lb)  BMI 33.11 kg/m2  SpO2 99%  General Appearance:    Alert, oriented, no distress, appears stated age  Head:    Normocephalic, atraumatic  Eyes:    PERRL, EOMI, sclera non-icteric        Nose:   Nares without drainage or epistaxis. Mucosa, turbinates normal  Throat:   Moist mucous membranes. Oropharynx without erythema or exudate.  Neck:   Supple. No carotid bruits.  No thyromegaly.  No lymphadenopathy.   Back:     No CVA tenderness, no spinal tenderness  Lungs:     Clear to auscultation bilaterally, without wheezes, rhonchi or rales  Chest wall:    No tenderness to palpitation  Heart:    Regular rate and rhythm without murmurs, gallops, rubs  Abdomen:     Soft, non-tender, nondistended, normal bowel sounds, no organomegaly  Genitalia:    deferred  Rectal:    deferred  Extremities:   No clubbing, cyanosis or edema.  Pulses:   2+ and symmetric all extremities  Skin:   Skin color, texture, turgor normal, no rashes or lesions  Lymph nodes:   Cervical, supraclavicular, and axillary nodes normal  Neurologic:   CNII-XII intact. Normal strength, sensation and reflexes      throughout    Labs on Admission:  Basic Metabolic Panel:  Recent Labs Lab 12/14/13 2000 12/14/13 2006  NA 122* 122*  K 3.2* 3.0*  CL 80* 85*  CO2 22  --   GLUCOSE 202* 197*  BUN 9 7  CREATININE 0.82 1.00  CALCIUM 9.8  --    Liver Function Tests:  Recent Labs Lab 12/14/13 2000  AST 42*  ALT 43  ALKPHOS 49  BILITOT 1.0  PROT 7.3  ALBUMIN 4.3   No results found for this basename: LIPASE, AMYLASE,  in the last 168 hours No results found for this basename: AMMONIA,  in the last 168 hours CBC:  Recent Labs Lab 12/14/13 2000 12/14/13 2006  WBC 10.2  --   HGB 14.2 15.3  HCT 40.3 45.0  MCV 85.7  --   PLT 261  --    Cardiac Enzymes:  Recent Labs Lab 12/14/13 2000 12/14/13 2345  TROPONINI <0.30 <0.30    BNP (last 3  results)  Recent Labs  12/14/13 2000  PROBNP 13.0   CBG:  Recent Labs Lab 12/14/13 2006  GLUCAP 181*    Radiological Exams on Admission: Ct Angio Head W/cm &/or Wo Cm  12/14/2013   CLINICAL DATA:  Abnormal chest x-ray, evaluate for dissection  EXAM: CT ANGIOGRAPHY HEAD AND NECK  TECHNIQUE: Multidetector CT imaging of the head and neck was performed using the standard protocol during bolus administration of intravenous contrast. Multiplanar CT image reconstructions and MIPs were obtained to evaluate the vascular anatomy. Carotid stenosis measurements (when applicable) are obtained utilizing NASCET criteria, using the distal internal carotid diameter as the denominator.  CONTRAST:  OMNIPAQUE IOHEXOL 350 MG/ML SOLN  COMPARISON:  Prior radiograph performed earlier on the same day.  FINDINGS: CTA HEAD FINDINGS  The noncontrast images of the brain demonstrate no evidence of acute intracranial hemorrhage or large vessel territory infarct. No mass lesion or midline shift. Cerebral volume within normal limits for patient age. No extra-axial fluid collection.  Calvarium is intact. Scalp soft tissues are normal. Orbits within normal limits.  Paranasal sinuses and mastoid air cells are clear.  Anterior circulation: Normal appearance of the distal cervical and petrous segments of the internal carotid arteries bilaterally. There is tortuosity of the cavernous portion of the right internal carotid artery without associated high-grade flow-limiting stenosis. Atherosclerotic calcifications cm within the cavernous left ICA with approximately 50% of short segment stenosis. Minimal atherosclerotic plaque noted within the cavernous right ICA without associated stenosis. The supra clinoid internal carotid arteries are within normal limits bilaterally. Widely patent anterior communicating artery. Normal appearance of the anterior and middle cerebral arteries.  Posterior circulation: Right vertebral artery is  dominant, with normal appearance of the vertebral arteries, vertebrobasilar junction and basilar artery, as well as main branch vessels. Small bilateral posterior communicating arteries are present. Normal appearance of posterior cerebral arteries.  No large vessel occlusion, hemodynamically significant stenosis, dissection, luminal irregularity, contrast extravasation or aneurysm within the anterior nor posterior circulation.  Review of the MIP images confirms the above findings.  CTA NECK FINDINGS  The examination is technically limited by motion artifact.  The visualized aortic arch is within normal limits with normal 3 vessel morphology. No stenosis seen at the origin of the great vessels. The subclavian arteries are widely patent and normal in caliber bilaterally.  The common carotid arteries are well opacified and of normal caliber without evidence of dissection or high-grade flow-limiting stenosis. Minimal atherosclerotic plaque noted about the right carotid bifurcation and proximal right internal carotid artery. The  internal carotid arteries are well opacified bilaterally without evidence of dissection or high-grade flow-limiting stenosis. Tortuosity of the distal right internal carotid artery noted.  The external carotid arteries and their branches are within normal limits.  The vertebral arteries both arise from the subclavian arteries. The right vertebral artery is dominant. Vertebral arteries are well opacified along their entire course without evidence of dissection, occlusion, or other focal abnormality.  Review of the MIP images confirms the above findings.  IMPRESSION: CTA HEAD:  1. Atherosclerotic plaque with associated short segment stenosis of approximately 50% within the cavernous left internal carotid artery. 2. Otherwise unremarkable CTA of the brain without evidence of high-grade flow-limiting stenosis, proximal branch occlusion, or intracranial aneurysm.  CTA NECK:  No CT evidence of  dissection or high-grade flow-limiting stenosis identified within the neck.   Electronically Signed   By: Rise Mu M.D.   On: 12/14/2013 23:40   Ct Angio Neck W/cm &/or Wo/cm  12/14/2013   CLINICAL DATA:  Abnormal chest x-ray, evaluate for dissection  EXAM: CT ANGIOGRAPHY HEAD AND NECK  TECHNIQUE: Multidetector CT imaging of the head and neck was performed using the standard protocol during bolus administration of intravenous contrast. Multiplanar CT image reconstructions and MIPs were obtained to evaluate the vascular anatomy. Carotid stenosis measurements (when applicable) are obtained utilizing NASCET criteria, using the distal internal carotid diameter as the denominator.  CONTRAST:  OMNIPAQUE IOHEXOL 350 MG/ML SOLN  COMPARISON:  Prior radiograph performed earlier on the same day.  FINDINGS: CTA HEAD FINDINGS  The noncontrast images of the brain demonstrate no evidence of acute intracranial hemorrhage or large vessel territory infarct. No mass lesion or midline shift. Cerebral volume within normal limits for patient age. No extra-axial fluid collection.  Calvarium is intact. Scalp soft tissues are normal. Orbits within normal limits.  Paranasal sinuses and mastoid air cells are clear.  Anterior circulation: Normal appearance of the distal cervical and petrous segments of the internal carotid arteries bilaterally. There is tortuosity of the cavernous portion of the right internal carotid artery without associated high-grade flow-limiting stenosis. Atherosclerotic calcifications cm within the cavernous left ICA with approximately 50% of short segment stenosis. Minimal atherosclerotic plaque noted within the cavernous right ICA without associated stenosis. The supra clinoid internal carotid arteries are within normal limits bilaterally. Widely patent anterior communicating artery. Normal appearance of the anterior and middle cerebral arteries.  Posterior circulation: Right vertebral artery is  dominant, with normal appearance of the vertebral arteries, vertebrobasilar junction and basilar artery, as well as main branch vessels. Small bilateral posterior communicating arteries are present. Normal appearance of posterior cerebral arteries.  No large vessel occlusion, hemodynamically significant stenosis, dissection, luminal irregularity, contrast extravasation or aneurysm within the anterior nor posterior circulation.  Review of the MIP images confirms the above findings.  CTA NECK FINDINGS  The examination is technically limited by motion artifact.  The visualized aortic arch is within normal limits with normal 3 vessel morphology. No stenosis seen at the origin of the great vessels. The subclavian arteries are widely patent and normal in caliber bilaterally.  The common carotid arteries are well opacified and of normal caliber without evidence of dissection or high-grade flow-limiting stenosis. Minimal atherosclerotic plaque noted about the right carotid bifurcation and proximal right internal carotid artery. The internal carotid arteries are well opacified bilaterally without evidence of dissection or high-grade flow-limiting stenosis. Tortuosity of the distal right internal carotid artery noted.  The external carotid arteries and their branches are within normal  limits.  The vertebral arteries both arise from the subclavian arteries. The right vertebral artery is dominant. Vertebral arteries are well opacified along their entire course without evidence of dissection, occlusion, or other focal abnormality.  Review of the MIP images confirms the above findings.  IMPRESSION: CTA HEAD:  1. Atherosclerotic plaque with associated short segment stenosis of approximately 50% within the cavernous left internal carotid artery. 2. Otherwise unremarkable CTA of the brain without evidence of high-grade flow-limiting stenosis, proximal branch occlusion, or intracranial aneurysm.  CTA NECK:  No CT evidence of  dissection or high-grade flow-limiting stenosis identified within the neck.   Electronically Signed   By: Rise Mu M.D.   On: 12/14/2013 23:40   Ct Chest W Contrast  12/14/2013   CLINICAL DATA:  Unexplained chest pain. Acute mental status changes.  EXAM: CT CHEST WITH CONTRAST  TECHNIQUE: Multidetector CT imaging of the chest was performed during intravenous contrast administration.  CONTRAST:  OMNIPAQUE IOHEXOL 350 MG/ML IV. This was also administered for the CT angio head and neck which was obtained simultaneously and is reported separately.  COMPARISON:  DG CHEST 1V PORT dated 12/14/2013; DG CHEST 2 VIEW dated 03/15/2013; DG CHEST 2V dated 03/15/2011  FINDINGS: Examination degraded by respiratory motion throughout. Normal heart size. Mild to moderate right coronary atherosclerosis. No pericardial effusion. No visible atherosclerosis involving the thoracic or upper abdominal aorta or their visualized branches.  Pulmonary parenchyma clear without localized airspace consolidation, interstitial disease, or parenchymal nodules or masses. Central airways patent without significant bronchial wall thickening.  Thickening of the wall of the distal esophagus without evidence of hiatal hernia. No significant mediastinal, hilar, or axillary lymphadenopathy. Diffuse thyroid gland enlargement with an approximate 1.8 x 1.5 cm nodule in the lower pole of the left lobe of the gland.  Focal hepatic steatosis adjacent to the falciform ligament. Stomach mildly distended with fluid. Visualized upper abdomen otherwise unremarkable. Bone window images unremarkable.  IMPRESSION: 1. Respiratory motion degraded examination demonstrates thickening of the wall of the distal esophagus without evidence of hiatal hernia, coronary esophagitis and/or the sequela of GE reflux disease. 2. Mild-to-moderate right coronary artery atherosclerosis. 3.  No acute cardiopulmonary disease. 4. Goiter with an approximate 1.8 x 1.5 cm nodule  in the lower pole of the left lobe of the thyroid gland. Non-emergent palpation thyroid ultrasound is indicated. This follows ACR consensus guidelines: Managing Incidental Thyroid Nodules Detected on Imaging: White Paper of the ACR Incidental Thyroid Findings Committee. J Am Coll Radiol 2015; 12:143-150.   Electronically Signed   By: Hulan Saas M.D.   On: 12/14/2013 23:28   Dg Chest Portable 1 View  12/14/2013   CLINICAL DATA:  53 year old male with chest pain.  EXAM: PORTABLE CHEST - 1 VIEW  COMPARISON:  03/23/2013  FINDINGS: Rounded prominence of the right suprahilar mediastinum is noted.  This is a low volume film.  Mild peribronchial thickening again identified.  There is no evidence of focal airspace disease, pulmonary edema, pleural effusion, or pneumothorax. No acute bony abnormalities are identified.  IMPRESSION: Right mediastinal prominence. Consider PA and lateral chest radiographs for initial evaluation versus chest CT with contrast.  No other significant abnormalities identified.   Electronically Signed   By: Laveda Abbe M.D.   On: 12/14/2013 20:40    EKG: Independently reviewed.  Assessment/Plan Active Problems:   Chest pain   Hyponatremia   Confusion   1. Chest pain - EKG was  Reviewed by EDP with cardiology, cardiology does not  believe that this represents STEMI at this time.  Putting patient in for obs, cycle enzymes. 2. Hyponatremia - suspicious that this is due to water intoxication, patient had 6 beers earlier today, and family thinking he was dehydrated report that they had him drink at least 5 glasses of water and a glass of orange juice.  His urine in the urinal at bedside is nearly clear, suggesting that his kidneys are diuresing the patient appropriately.  Will also hold his HCTZ he takes for chronic HTN.  Urine lytes pending, recheck BMP in AM. 3. Confusion - most likely due to hyponatremia, CT head negative, no focal neurologic findings at this time, observe for  improvement.    Code Status: Full  Family Communication: Family at bedside Disposition Plan: Admit to obs   Time spent: 70 min  GARDNER, JARED M. [facility] Pager [phone]  If 7AM-7PM, please contact the day team taking care of the patient [email or web address] Password [email or web address] 12/15/2013, 12:39 AM

## 2013-12-15 NOTE — Discharge Summary (Addendum)
PATIENT DETAILS Name: Christopher Tyler Age: 53 y.o. Sex: male Date of Birth: [birth year] MRN: [id number]. Admit Date: 12/14/2013 Admitting Physician: Hillary Bow, DO PCP:No primary provider on file.  Recommendations for Outpatient Follow-up:  1. Outpatient cardiac stress test 2. Counseled regarding the importance of stopping tobacco use 3. Counseled regarding stopping alcohol use  PRIMARY DISCHARGE DIAGNOSIS:  Active Problems:   Chest pain   Hyponatremia   Confusion      PAST MEDICAL HISTORY: Past Medical History  Diagnosis Date  . Diabetes mellitus without complication   . Hypertension   . High cholesterol   . Ankle fracture, right     steel plate    DISCHARGE MEDICATIONS:   Medication List    STOP taking these medications       benazepril-hydrochlorthiazide 10-12.5 MG per tablet  Commonly known as:  LOTENSIN HCT      TAKE these medications       aspirin EC 325 MG tablet  Take 1 tablet (325 mg total) by mouth daily.     atorvastatin 10 MG tablet  Commonly known as:  LIPITOR  Take 10 mg by mouth daily.     benazepril 20 MG tablet  Commonly known as:  LOTENSIN  Take 1 tablet (20 mg total) by mouth daily.     metFORMIN 500 MG 24 hr tablet  Commonly known as:  GLUCOPHAGE-XR  Take 1 tablet by mouth 2 (two) times daily.     pantoprazole 40 MG tablet  Commonly known as:  PROTONIX  Take 1 tablet (40 mg total) by mouth daily. Switch for any other PPI at similar dose and frequency        ALLERGIES:  No Known Allergies  BRIEF HPI:  See H&P, Labs, Consult and Test reports for all details in brief, patient was admitted for chest pain and confusion. For further details please see the history and physical dictated on admission  CONSULTATIONS:   None  PERTINENT RADIOLOGIC STUDIES: Ct Angio Head W/cm &/or Wo Cm  12/14/2013   CLINICAL DATA:  Abnormal chest x-ray, evaluate for dissection  EXAM: CT ANGIOGRAPHY HEAD AND NECK  TECHNIQUE: Multidetector CT imaging  of the head and neck was performed using the standard protocol during bolus administration of intravenous contrast. Multiplanar CT image reconstructions and MIPs were obtained to evaluate the vascular anatomy. Carotid stenosis measurements (when applicable) are obtained utilizing NASCET criteria, using the distal internal carotid diameter as the denominator.  CONTRAST:  OMNIPAQUE IOHEXOL 350 MG/ML SOLN  COMPARISON:  Prior radiograph performed earlier on the same day.  FINDINGS: CTA HEAD FINDINGS  The noncontrast images of the brain demonstrate no evidence of acute intracranial hemorrhage or large vessel territory infarct. No mass lesion or midline shift. Cerebral volume within normal limits for patient age. No extra-axial fluid collection.  Calvarium is intact. Scalp soft tissues are normal. Orbits within normal limits.  Paranasal sinuses and mastoid air cells are clear.  Anterior circulation: Normal appearance of the distal cervical and petrous segments of the internal carotid arteries bilaterally. There is tortuosity of the cavernous portion of the right internal carotid artery without associated high-grade flow-limiting stenosis. Atherosclerotic calcifications cm within the cavernous left ICA with approximately 50% of short segment stenosis. Minimal atherosclerotic plaque noted within the cavernous right ICA without associated stenosis. The supra clinoid internal carotid arteries are within normal limits bilaterally. Widely patent anterior communicating artery. Normal appearance of the anterior and middle cerebral arteries.  Posterior circulation: Right vertebral artery  is dominant, with normal appearance of the vertebral arteries, vertebrobasilar junction and basilar artery, as well as main branch vessels. Small bilateral posterior communicating arteries are present. Normal appearance of posterior cerebral arteries.  No large vessel occlusion, hemodynamically significant stenosis, dissection, luminal  irregularity, contrast extravasation or aneurysm within the anterior nor posterior circulation.  Review of the MIP images confirms the above findings.  CTA NECK FINDINGS  The examination is technically limited by motion artifact.  The visualized aortic arch is within normal limits with normal 3 vessel morphology. No stenosis seen at the origin of the great vessels. The subclavian arteries are widely patent and normal in caliber bilaterally.  The common carotid arteries are well opacified and of normal caliber without evidence of dissection or high-grade flow-limiting stenosis. Minimal atherosclerotic plaque noted about the right carotid bifurcation and proximal right internal carotid artery. The internal carotid arteries are well opacified bilaterally without evidence of dissection or high-grade flow-limiting stenosis. Tortuosity of the distal right internal carotid artery noted.  The external carotid arteries and their branches are within normal limits.  The vertebral arteries both arise from the subclavian arteries. The right vertebral artery is dominant. Vertebral arteries are well opacified along their entire course without evidence of dissection, occlusion, or other focal abnormality.  Review of the MIP images confirms the above findings.  IMPRESSION: CTA HEAD:  1. Atherosclerotic plaque with associated short segment stenosis of approximately 50% within the cavernous left internal carotid artery. 2. Otherwise unremarkable CTA of the brain without evidence of high-grade flow-limiting stenosis, proximal branch occlusion, or intracranial aneurysm.  CTA NECK:  No CT evidence of dissection or high-grade flow-limiting stenosis identified within the neck.   Electronically Signed   By: Rise Mu M.D.   On: 12/14/2013 23:40   Ct Angio Neck W/cm &/or Wo/cm  12/14/2013   CLINICAL DATA:  Abnormal chest x-ray, evaluate for dissection  EXAM: CT ANGIOGRAPHY HEAD AND NECK  TECHNIQUE: Multidetector CT imaging of  the head and neck was performed using the standard protocol during bolus administration of intravenous contrast. Multiplanar CT image reconstructions and MIPs were obtained to evaluate the vascular anatomy. Carotid stenosis measurements (when applicable) are obtained utilizing NASCET criteria, using the distal internal carotid diameter as the denominator.  CONTRAST:  OMNIPAQUE IOHEXOL 350 MG/ML SOLN  COMPARISON:  Prior radiograph performed earlier on the same day.  FINDINGS: CTA HEAD FINDINGS  The noncontrast images of the brain demonstrate no evidence of acute intracranial hemorrhage or large vessel territory infarct. No mass lesion or midline shift. Cerebral volume within normal limits for patient age. No extra-axial fluid collection.  Calvarium is intact. Scalp soft tissues are normal. Orbits within normal limits.  Paranasal sinuses and mastoid air cells are clear.  Anterior circulation: Normal appearance of the distal cervical and petrous segments of the internal carotid arteries bilaterally. There is tortuosity of the cavernous portion of the right internal carotid artery without associated high-grade flow-limiting stenosis. Atherosclerotic calcifications cm within the cavernous left ICA with approximately 50% of short segment stenosis. Minimal atherosclerotic plaque noted within the cavernous right ICA without associated stenosis. The supra clinoid internal carotid arteries are within normal limits bilaterally. Widely patent anterior communicating artery. Normal appearance of the anterior and middle cerebral arteries.  Posterior circulation: Right vertebral artery is dominant, with normal appearance of the vertebral arteries, vertebrobasilar junction and basilar artery, as well as main branch vessels. Small bilateral posterior communicating arteries are present. Normal appearance of posterior cerebral arteries.  No  large vessel occlusion, hemodynamically significant stenosis, dissection, luminal  irregularity, contrast extravasation or aneurysm within the anterior nor posterior circulation.  Review of the MIP images confirms the above findings.  CTA NECK FINDINGS  The examination is technically limited by motion artifact.  The visualized aortic arch is within normal limits with normal 3 vessel morphology. No stenosis seen at the origin of the great vessels. The subclavian arteries are widely patent and normal in caliber bilaterally.  The common carotid arteries are well opacified and of normal caliber without evidence of dissection or high-grade flow-limiting stenosis. Minimal atherosclerotic plaque noted about the right carotid bifurcation and proximal right internal carotid artery. The internal carotid arteries are well opacified bilaterally without evidence of dissection or high-grade flow-limiting stenosis. Tortuosity of the distal right internal carotid artery noted.  The external carotid arteries and their branches are within normal limits.  The vertebral arteries both arise from the subclavian arteries. The right vertebral artery is dominant. Vertebral arteries are well opacified along their entire course without evidence of dissection, occlusion, or other focal abnormality.  Review of the MIP images confirms the above findings.  IMPRESSION: CTA HEAD:  1. Atherosclerotic plaque with associated short segment stenosis of approximately 50% within the cavernous left internal carotid artery. 2. Otherwise unremarkable CTA of the brain without evidence of high-grade flow-limiting stenosis, proximal branch occlusion, or intracranial aneurysm.  CTA NECK:  No CT evidence of dissection or high-grade flow-limiting stenosis identified within the neck.   Electronically Signed   By: Rise Mu M.D.   On: 12/14/2013 23:40   Ct Chest W Contrast  12/14/2013   CLINICAL DATA:  Unexplained chest pain. Acute mental status changes.  EXAM: CT CHEST WITH CONTRAST  TECHNIQUE: Multidetector CT imaging of the chest  was performed during intravenous contrast administration.  CONTRAST:  OMNIPAQUE IOHEXOL 350 MG/ML IV. This was also administered for the CT angio head and neck which was obtained simultaneously and is reported separately.  COMPARISON:  DG CHEST 1V PORT dated 12/14/2013; DG CHEST 2 VIEW dated 03/15/2013; DG CHEST 2V dated 03/15/2011  FINDINGS: Examination degraded by respiratory motion throughout. Normal heart size. Mild to moderate right coronary atherosclerosis. No pericardial effusion. No visible atherosclerosis involving the thoracic or upper abdominal aorta or their visualized branches.  Pulmonary parenchyma clear without localized airspace consolidation, interstitial disease, or parenchymal nodules or masses. Central airways patent without significant bronchial wall thickening.  Thickening of the wall of the distal esophagus without evidence of hiatal hernia. No significant mediastinal, hilar, or axillary lymphadenopathy. Diffuse thyroid gland enlargement with an approximate 1.8 x 1.5 cm nodule in the lower pole of the left lobe of the gland.  Focal hepatic steatosis adjacent to the falciform ligament. Stomach mildly distended with fluid. Visualized upper abdomen otherwise unremarkable. Bone window images unremarkable.  IMPRESSION: 1. Respiratory motion degraded examination demonstrates thickening of the wall of the distal esophagus without evidence of hiatal hernia, coronary esophagitis and/or the sequela of GE reflux disease. 2. Mild-to-moderate right coronary artery atherosclerosis. 3.  No acute cardiopulmonary disease. 4. Goiter with an approximate 1.8 x 1.5 cm nodule in the lower pole of the left lobe of the thyroid gland. Non-emergent palpation thyroid ultrasound is indicated. This follows ACR consensus guidelines: Managing Incidental Thyroid Nodules Detected on Imaging: White Paper of the ACR Incidental Thyroid Findings Committee. J Am Coll Radiol 2015; 12:143-150.   Electronically Signed   By: Hulan Saas M.D.   On: 12/14/2013 23:28   Dg Chest  Portable 1 View  12/14/2013   CLINICAL DATA:  53 year old male with chest pain.  EXAM: PORTABLE CHEST - 1 VIEW  COMPARISON:  03/23/2013  FINDINGS: Rounded prominence of the right suprahilar mediastinum is noted.  This is a low volume film.  Mild peribronchial thickening again identified.  There is no evidence of focal airspace disease, pulmonary edema, pleural effusion, or pneumothorax. No acute bony abnormalities are identified.  IMPRESSION: Right mediastinal prominence. Consider PA and lateral chest radiographs for initial evaluation versus chest CT with contrast.  No other significant abnormalities identified.   Electronically Signed   By: Laveda Abbe M.D.   On: 12/14/2013 20:40     PERTINENT LAB RESULTS: CBC:  Recent Labs  12/14/13 2000 12/14/13 2006  WBC 10.2  --   HGB 14.2 15.3  HCT 40.3 45.0  PLT 261  --    CMET CMP     Component Value Date/Time   NA 140 12/15/2013 0615   K 4.4 12/15/2013 0615   CL 102 12/15/2013 0615   CO2 27 12/15/2013 0615   GLUCOSE 149* 12/15/2013 0615   BUN 8 12/15/2013 0615   CREATININE 0.83 12/15/2013 0615   CALCIUM 9.4 12/15/2013 0615   PROT 7.3 12/14/2013 2000   ALBUMIN 4.3 12/14/2013 2000   AST 42* 12/14/2013 2000   ALT 43 12/14/2013 2000   ALKPHOS 49 12/14/2013 2000   BILITOT 1.0 12/14/2013 2000   GFRNONAA >90 12/15/2013 0615   GFRAA >90 12/15/2013 0615    GFR Estimated Creatinine Clearance: 100.7 ml/min (by C-G formula based on Cr of 0.83). No results found for this basename: LIPASE, AMYLASE,  in the last 72 hours  Recent Labs  12/14/13 2000 12/14/13 2345 12/15/13 0615  TROPONINI <0.30 <0.30 <0.30   No components found with this basename: POCBNP,  No results found for this basename: DDIMER,  in the last 72 hours No results found for this basename: HGBA1C,  in the last 72 hours No results found for this basename: CHOL, HDL, LDLCALC, TRIG, CHOLHDL, LDLDIRECT,  in the last 72 hours No results found  for this basename: TSH, T4TOTAL, FREET3, T3FREE, THYROIDAB,  in the last 72 hours No results found for this basename: VITAMINB12, FOLATE, FERRITIN, TIBC, IRON, RETICCTPCT,  in the last 72 hours Coags:  Recent Labs  12/14/13 2000  INR 0.94   Microbiology: No results found for this or any previous visit (from the past 240 hour(s)).   BRIEF HOSPITAL COURSE:   Active Problems:   Chest pain - Likely atypical and has resolved. However, has significant risk factors for cardiac-related chest pain which includes diabetes, dyslipidemia, hypertension and smoking. CT scan of the chest did show some calcifications in the right coronary artery, however patient does not want to pursue any further inpatient workup including a stress test. He wants to be discharged home today, he claims that he will go to his primary care practitioner-Dr. Katrinka Blazing and try and get a outpatient stress test arranged. Patient's spouse is at bedside, and wants further workup to be done in the outpatient setting as well. CT scan of the chest also did show some esophageal thickening, suspect chest pain could have been secondary to reflux. Will place on PPI on discharge as well. - Both patient and wife have been told that in case chest pain recurs he is to seek immediate medical attention, both of them claim understanding.    Hyponatremia - Suspect multifactorial with beer potomania, free water intake (claims to drink half a  gallon of water a more daily basis) and HCTZ use. Patient will go cautiously hydrated, sodium is normalized by the time of discharge. - Will discontinue HCTZ at the time of discharge.  Encephalopathy - Likely multifactorial, combination of alcohol and hyponatremia contributing. - CT head negative, now back to baseline and am awaiting the bone. Stable for discharge.  Hypertension - Stable, continue benazepril-will increase dose to 20 mg as we are stopping HCTZ.  Carotid artery stenosis -Atherosclerotic plaque  with associated short segment stenosis of approximately 50% within the cavernous left internal carotid artery. Continue to monitor in the outpatient setting.  Diabetes - Continue metformin  Dyslipidemia - Continue statin  Tobacco abuse - Counseled extensively  Alcohol use - Claim to the admitting physician that he drank a six pack beer yesterday, however today he claims that he only drank one or 2 beers yesterday. Suspect that he is much more heavy drinker currently acknowledging.  TODAY-DAY OF DISCHARGE:  Subjective:   Alinda Moneyony Wempe today has no headache,no chest abdominal pain,no new weakness tingling or numbness, feels much better wants to go home today.   Objective:   Blood pressure 98/51, pulse 87, temperature 98.3 F (36.8 C), temperature source Oral, resp. rate 20, height 5\' 4"  (1.626 m), weight 86.138 kg (189 lb 14.4 oz), SpO2 98.00%.  Intake/Output Summary (Last 24 hours) at 12/15/13 1219 Last data filed at 12/15/13 1003  Gross per 24 hour  Intake    540 ml  Output   1525 ml  Net   -985 ml   Filed Weights   12/14/13 2023 12/15/13 0100  Weight: 87.544 kg (193 lb) 86.138 kg (189 lb 14.4 oz)    Exam Awake Alert, Oriented *3, No new F.N deficits, Normal affect [place].AT,PERRAL Supple Neck,No JVD, No cervical lymphadenopathy appriciated.  Symmetrical Chest wall movement, Good air movement bilaterally, CTAB RRR,No Gallops,Rubs or new Murmurs, No Parasternal Heave +ve B.Sounds, Abd Soft, Non tender, No organomegaly appriciated, No rebound -guarding or rigidity. No Cyanosis, Clubbing or edema, No new Rash or bruise  DISCHARGE CONDITION: Stable  DISPOSITION: Home  DISCHARGE INSTRUCTIONS:    Activity:  As tolerated   Diet recommendation: Diabetic Diet Heart Healthy diet  Discharge Orders   Future Orders Complete By Expires   Call MD for:  persistant nausea and vomiting  As directed    Call MD for:  severe uncontrolled pain  As directed    Diet - low sodium  heart healthy  As directed    Increase activity slowly  As directed       Follow-up Information   Follow up with Allean FoundSMITH,CANDACE THIELE, MD. Schedule an appointment as soon as possible for a visit in 3 days.   Specialty:  Family Medicine   Contact information:   [address], [address] [place] [address] [phone]      Total Time spent on discharge equals 45 minutes.  SignedJeoffrey Massed: Liboria Putnam 12/15/2013 12:19 PM

## 2013-12-15 NOTE — Progress Notes (Signed)
Patient discharged home with wife, discharge instructions given and explained to patient/wife and they verbalized understanding, patient denies any pain/distress. Skin intact, no wound. Accompanied home by wife.

## 2013-12-17 ENCOUNTER — Encounter: Payer: Self-pay | Admitting: Cardiovascular Disease

## 2013-12-17 ENCOUNTER — Ambulatory Visit (INDEPENDENT_AMBULATORY_CARE_PROVIDER_SITE_OTHER): Payer: BC Managed Care – PPO | Admitting: Cardiovascular Disease

## 2013-12-17 VITALS — BP 148/80 | HR 80 | Ht 64.0 in | Wt 186.8 lb

## 2013-12-17 DIAGNOSIS — I6529 Occlusion and stenosis of unspecified carotid artery: Secondary | ICD-10-CM | POA: Insufficient documentation

## 2013-12-17 DIAGNOSIS — E119 Type 2 diabetes mellitus without complications: Secondary | ICD-10-CM

## 2013-12-17 DIAGNOSIS — R079 Chest pain, unspecified: Secondary | ICD-10-CM

## 2013-12-17 DIAGNOSIS — I2510 Atherosclerotic heart disease of native coronary artery without angina pectoris: Secondary | ICD-10-CM

## 2013-12-17 DIAGNOSIS — F17200 Nicotine dependence, unspecified, uncomplicated: Secondary | ICD-10-CM

## 2013-12-17 DIAGNOSIS — I10 Essential (primary) hypertension: Secondary | ICD-10-CM

## 2013-12-17 NOTE — Assessment & Plan Note (Addendum)
He was recently in the emergency room for symptoms of dehydration, hyponatremia, hypokalemia due to working out in the yard all day and drinking nothing but water. He incidentally noted that he had some chest discomfort. He performed a CT angiogram of the chest and he was noted have some coronary athersclerosis in the mild right coronary artery atherosclerosis.  He has occasional episodes of chest tightness that seem to be related to anxiety but I cannot exclude the possibility that this may be angina. We'll schedule him for a stress Myoview study for further evaluation.  I'll see him in 6 months for followup office visit. We'll check fasting lipids at that time. He'll call if he has any episodes of chest discomfort.  Has multiple risk factors for coronary disease including diabetes mellitus, hypertension, hyperlipidemia, cigarette smoking.

## 2013-12-17 NOTE — Progress Notes (Signed)
Christopher Tyler Date of Birth  10/13/1960       [facility]    [place] [facility] [address]  [address] [place]  [address]   [place]  [id number] [phone]    [phone]   Fax  [phone]     Fax [phone]  Problem List: 1. Hypertension 2. Dyslipidemia 3. Diabetes mellitus 4. History of cigarette smoking  History of Present Illness:  Christopher Tyler was seen recently in the emergency room for chest discomfort and mental status changes.   He had worked in the yard all day and was drinking lots of water.  He was found to have hyponatremia and hypokalemia.     A CT of the chest revealed moderate coronary artery disease of the right coronary artery.  He also had a CT angiogram of the neck which revealed a 50% stenosis of the left carotid artery.  He has occasional episodes of chest tightness.  These episodes seem to be related to anxiety. They last for a few seconds. He may have them once or twice a month.  He exercises on occasion. He is typically not had any episodes of chest discomfort with walking.   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    Current Outpatient Prescriptions on File Prior to Visit  Medication Sig Dispense Refill  . aspirin EC 325 MG tablet Take 1 tablet (325 mg total) by mouth daily.  30 tablet  0  . atorvastatin (LIPITOR) 10 MG tablet Take 10 mg by mouth daily.      . benazepril (LOTENSIN) 20 MG tablet Take 1 tablet (20 mg total) by mouth daily.  30 tablet  0  . metFORMIN (GLUCOPHAGE-XR) 500 MG 24 hr tablet Take 1 tablet by mouth 2 (two) times daily.       . pantoprazole (PROTONIX) 40 MG tablet Take 1 tablet (40 mg total) by mouth daily. Switch for any other PPI at similar dose and frequency  30 tablet  0   No current facility-administered medications on file prior to visit.    No Known Allergies  Past Medical History  Diagnosis Date  . Diabetes mellitus without complication     . Hypertension   . High cholesterol   . Ankle fracture, right     steel plate    No past surgical history on file.  History  Smoking status  . Current Every Day Smoker -- 0.50 packs/day  . Types: Cigarettes  Smokeless tobacco  . Never Used    History  Alcohol Use  . 1.8 oz/week  . 3 Cans of beer per week    No family history on file.  Reviw of Systems:  Reviewed in the HPI.  All other systems are negative.  Physical Exam: Blood pressure 148/80, pulse 80, height 5\' 4"  (1.626 m), weight 186 lb 12.8 oz (84.732 kg). Wt Readings from Last 3 Encounters:  12/17/13 186 lb 12.8 oz (84.732 kg)  12/15/13 189 lb 14.4 oz (86.138 kg)     General: Well developed, well nourished, in no acute distress.  Head: Normocephalic, atraumatic, sclera non-icteric, mucus membranes are moist,   Neck: Supple. Carotids are 2 + without bruits. No JVD   Lungs: Clear   Heart: RR, normla S1S2  Abdomen: Soft, non-tender, non-distended with normal bowel sounds.  Msk:  Strength and tone are normal   Extremities: No clubbing or cyanosis. No edema.  Distal pedal pulses are  2+ and equal    Neuro: CN II - XII intact.  Alert and oriented X 3.  Psych:  Normal   ECG: December 17, 2013:   Assessment / Plan:

## 2013-12-17 NOTE — Assessment & Plan Note (Signed)
He has mild/moderate disease of his left carotid artery. We will make sure that we treat his hyperlipidemia aggressively. We'll recheck a duplex scan in one year.

## 2013-12-17 NOTE — Patient Instructions (Addendum)
Your physician has requested that you have en exercise stress myoview.  Please follow instruction sheet, as given.  Your physician has requested that you have a carotid duplex IN 1 YEAR // PLEASE PLACE A REMINDER. This test is an ultrasound of the carotid arteries in your neck. It looks at blood flow through these arteries that supply the brain with blood. Allow one hour for this exam. There are no restrictions or special instructions.  Your physician recommends that you schedule a follow-up appointment in: 6 MONTHS    Your physician recommends that you return for a FASTING lipid profile: 6 MONTHS

## 2013-12-18 ENCOUNTER — Ambulatory Visit (HOSPITAL_COMMUNITY)
Admission: RE | Admit: 2013-12-18 | Discharge: 2013-12-18 | Disposition: A | Discharge status: Home / Self Care | Payer: BC Managed Care – PPO | Source: Ambulatory Visit | Attending: Cardiovascular Disease | Admitting: Cardiovascular Disease

## 2013-12-18 ENCOUNTER — Other Ambulatory Visit: Payer: Self-pay | Admitting: *Deleted

## 2013-12-18 DIAGNOSIS — R079 Chest pain, unspecified: Secondary | ICD-10-CM

## 2013-12-18 DIAGNOSIS — F17200 Nicotine dependence, unspecified, uncomplicated: Secondary | ICD-10-CM

## 2013-12-18 DIAGNOSIS — I10 Essential (primary) hypertension: Secondary | ICD-10-CM

## 2013-12-18 DIAGNOSIS — I2510 Atherosclerotic heart disease of native coronary artery without angina pectoris: Secondary | ICD-10-CM

## 2013-12-18 DIAGNOSIS — E119 Type 2 diabetes mellitus without complications: Secondary | ICD-10-CM | POA: Insufficient documentation

## 2013-12-18 MED ORDER — TECHNETIUM TC 99M SESTAMIBI GENERIC - CARDIOLITE
10.0000 | Freq: Once | INTRAVENOUS | Status: AC | PRN
  Administered 2013-12-18: 10 via INTRAVENOUS

## 2013-12-18 MED ORDER — TECHNETIUM TC 99M SESTAMIBI GENERIC - CARDIOLITE
30.0000 | Freq: Once | INTRAVENOUS | Status: AC | PRN
  Administered 2013-12-18: 30 via INTRAVENOUS

## 2013-12-18 NOTE — Progress Notes (Signed)
PT NEEDS STRESS MYOVIEW/ ORDER WAS PLACED PRIOR FOR LEXI MYOVIEW, ORDER RE WRITTEN.

## 2013-12-18 NOTE — Procedures (Addendum)
[facility] [address] [place] [address] [phone]  Cardiology Nuclear Med Study  Christopher Tyler is a 53 y.o. male     MRN : [id number]     DOB: 09/19/1961  Procedure Date: 12/18/2013  Nuclear Med Background Indication for Stress Test:  Evaluation for Ischemia and Post Hospital History:  CAD;HYPONATREMIA ;confusion Cardiac Risk Factors: Carotid Disease, Hypertension, Lipids, NIDDM and Smoker  Symptoms:  Chest Pain, Dizziness and Light-Headedness   Nuclear Pre-Procedure Caffeine/Decaff Intake:  12:00am NPO After: 10am   IV Site: R Antecubital  IV 0.9% NS with Angio Cath:  22g  Chest Size (in):  44"  IV Started by: Emmit PomfretAmanda Hicks, RN  Height: 5\' 4"  (1.626 m)  Cup Size: n/a  BMI:  Body mass index is 31.91 kg/(m^2). Weight:  186 lb (84.369 kg)   Tech Comments:  n/a    Nuclear Med Study 1 or 2 day study: 1 day  Stress Test Type:  Stress  Order Authorizing Provider:  Laqueta CarinaPhilip Nasher, MD   Resting Radionuclide: Technetium 5045m Sestamibi  Resting Radionuclide Dose: 10.6 mCi   Stress Radionuclide:  Technetium 7545m Sestamibi  Stress Radionuclide Dose: 29.1 mCi           Stress Protocol Rest HR: 56 Stress HR:148  Rest BP: 126/81 Stress BP: 205/131  Exercise Time (min): 12:01 METS: 13.00   Predicted Max HR: 166 bpm % Max HR: 89.16 bpm Rate Pressure Product: [id number]  Dose of Adenosine (mg):  n/a Dose of Lexiscan: n/a mg  Dose of Atropine (mg): n/a Dose of Dobutamine: n/a mcg/kg/min (at max HR)  Stress Test Technologist: Ernestene MentionGwen Farrington, CCT Nuclear Technologist: Koren Shiverobin Moffitt, CNMT   Rest Procedure:  Myocardial perfusion imaging was performed at rest 45 minutes following the intravenous administration of Technetium 5245m Sestamibi. Stress Procedure:  The patient performed treadmill exercise using a Bruce  Protocol for 12 minutes and 1 second. The patient stopped due to generalized fatigue. Patient denied any chest pain.  There  were no significant ST-T wave changes.  Technetium 9045m Sestamibi was injected at peak exercise and myocardial perfusion imaging was performed after a brief delay.  Transient Ischemic Dilatation (Normal <1.22):  0.90 Lung/Heart Ratio (Normal <0.45):  0.29 QGS EDV:  102 ml QGS ESV:  49 ml LV Ejection Fraction: 52%        Rest ECG: NSR - Normal EKG  Stress ECG: No significant change from baseline ECG  QPS Raw Data Images:  Normal; no motion artifact; normal heart/lung ratio. Stress Images:  Normal homogeneous uptake in all areas of the myocardium. Rest Images:  Normal homogeneous uptake in all areas of the myocardium. Subtraction (SDS):  No evidence of ischemia.  Impression Exercise Capacity:  Excellent exercise capacity. BP Response:  Normal blood pressure response. Clinical Symptoms:  No significant symptoms noted. ECG Impression:  No significant ST segment change suggestive of ischemia. Comparison with Prior Nuclear Study: No previous nuclear study performed  Overall Impression:  Normal stress nuclear study.  LV Wall Motion:  NL LV Function; NL Wall Motion   Runell GessBERRY,JONATHAN J, MD  12/18/2013 5:20 PM

## 2013-12-19 ENCOUNTER — Telehealth: Payer: Self-pay | Admitting: Cardiovascular Disease

## 2013-12-19 NOTE — Telephone Encounter (Signed)
Per Dr Elease HashimotoNahser, pts cardiac stress test is normal, he may return to work.  Pt was told he needs to continue medical therapy for carotid stenosis, HTN and hyperlipidemia. Told to keep return f/u app.  Pt verbalized understanding.

## 2013-12-19 NOTE — Telephone Encounter (Signed)
New message  Patient has several questions regarding his care.   When does he go back to work? And What is the next step in his care. Please call and advise.

## 2014-01-09 ENCOUNTER — Other Ambulatory Visit: Payer: Self-pay | Admitting: Internal Medicine

## 2019-11-23 ENCOUNTER — Ambulatory Visit: Payer: BC Managed Care – PPO

## 2021-07-03 ENCOUNTER — Emergency Department (HOSPITAL_BASED_OUTPATIENT_CLINIC_OR_DEPARTMENT_OTHER)
Admission: EM | Admit: 2021-07-03 | Discharge: 2021-07-03 | Disposition: A | Discharge status: Home / Self Care | Payer: BC Managed Care – PPO | Attending: Emergency Medicine | Admitting: Emergency Medicine

## 2021-07-03 ENCOUNTER — Emergency Department (HOSPITAL_BASED_OUTPATIENT_CLINIC_OR_DEPARTMENT_OTHER): Payer: BC Managed Care – PPO

## 2021-07-03 ENCOUNTER — Encounter (HOSPITAL_BASED_OUTPATIENT_CLINIC_OR_DEPARTMENT_OTHER): Payer: Self-pay | Admitting: Emergency Medicine

## 2021-07-03 ENCOUNTER — Other Ambulatory Visit: Payer: Self-pay

## 2021-07-03 DIAGNOSIS — I10 Essential (primary) hypertension: Secondary | ICD-10-CM | POA: Insufficient documentation

## 2021-07-03 DIAGNOSIS — R1030 Lower abdominal pain, unspecified: Secondary | ICD-10-CM | POA: Diagnosis present

## 2021-07-03 DIAGNOSIS — Z7982 Long term (current) use of aspirin: Secondary | ICD-10-CM | POA: Insufficient documentation

## 2021-07-03 DIAGNOSIS — K4090 Unilateral inguinal hernia, without obstruction or gangrene, not specified as recurrent: Secondary | ICD-10-CM | POA: Insufficient documentation

## 2021-07-03 DIAGNOSIS — Z7984 Long term (current) use of oral hypoglycemic drugs: Secondary | ICD-10-CM | POA: Diagnosis not present

## 2021-07-03 DIAGNOSIS — E119 Type 2 diabetes mellitus without complications: Secondary | ICD-10-CM | POA: Diagnosis not present

## 2021-07-03 DIAGNOSIS — I2510 Atherosclerotic heart disease of native coronary artery without angina pectoris: Secondary | ICD-10-CM | POA: Diagnosis not present

## 2021-07-03 DIAGNOSIS — R197 Diarrhea, unspecified: Secondary | ICD-10-CM | POA: Insufficient documentation

## 2021-07-03 DIAGNOSIS — F17210 Nicotine dependence, cigarettes, uncomplicated: Secondary | ICD-10-CM | POA: Diagnosis not present

## 2021-07-03 LAB — CBC WITH DIFFERENTIAL/PLATELET
Abs Immature Granulocytes: 0.03 10*3/uL (ref 0.00–0.07)
Basophils Absolute: 0 10*3/uL (ref 0.0–0.1)
Basophils Relative: 0 %
Eosinophils Absolute: 0 10*3/uL (ref 0.0–0.5)
Eosinophils Relative: 1 %
HCT: 44.2 % (ref 39.0–52.0)
Hemoglobin: 14.7 g/dL (ref 13.0–17.0)
Immature Granulocytes: 1 %
Lymphocytes Relative: 45 %
Lymphs Abs: 2.5 10*3/uL (ref 0.7–4.0)
MCH: 30.4 pg (ref 26.0–34.0)
MCHC: 33.3 g/dL (ref 30.0–36.0)
MCV: 91.5 fL (ref 80.0–100.0)
Monocytes Absolute: 0.4 10*3/uL (ref 0.1–1.0)
Monocytes Relative: 8 %
Neutro Abs: 2.5 10*3/uL (ref 1.7–7.7)
Neutrophils Relative %: 45 %
Platelets: 204 10*3/uL (ref 150–400)
RBC: 4.83 MIL/uL (ref 4.22–5.81)
RDW: 12.4 % (ref 11.5–15.5)
WBC: 5.5 10*3/uL (ref 4.0–10.5)
nRBC: 0 % (ref 0.0–0.2)

## 2021-07-03 LAB — URINALYSIS, ROUTINE W REFLEX MICROSCOPIC
Bilirubin Urine: NEGATIVE
Glucose, UA: >1000 mg/dL — AB
Hgb urine dipstick: NEGATIVE
Ketones, ur: NEGATIVE mg/dL
Leukocytes,Ua: NEGATIVE
Nitrite: NEGATIVE
Protein, ur: NEGATIVE mg/dL
Specific Gravity, Urine: 1.006 (ref 1.005–1.030)
pH: 5.5 (ref 5.0–8.0)

## 2021-07-03 LAB — BASIC METABOLIC PANEL
Anion gap: 9 (ref 5–15)
BUN: 11 mg/dL (ref 8–23)
CO2: 29 mmol/L (ref 22–32)
Calcium: 9.6 mg/dL (ref 8.9–10.3)
Chloride: 101 mmol/L (ref 98–111)
Creatinine, Ser: 0.71 mg/dL (ref 0.61–1.24)
GFR, Estimated: >60 mL/min (ref >60)
Glucose, Bld: 128 mg/dL — ABNORMAL HIGH (ref 70–99)
Potassium: 4.3 mmol/L (ref 3.5–5.1)
Sodium: 139 mmol/L (ref 135–145)

## 2021-07-03 MED ORDER — OXYCODONE HCL 5 MG PO TABS
5.0000 mg | ORAL_TABLET | ORAL | 0 refills | Status: AC | PRN
Start: 2021-07-03 — End: 2021-07-06

## 2021-07-03 MED ORDER — IOHEXOL 300 MG/ML  SOLN
100.0000 mL | Freq: Once | INTRAMUSCULAR | Status: AC | PRN
  Administered 2021-07-03: 100 mL via INTRAVENOUS

## 2021-07-03 NOTE — ED Provider Notes (Signed)
[facility] EMERGENCY DEPT Provider Note   CSN: [id number] Arrival date & time: 07/03/21  1213     History Chief Complaint  Patient presents with   Groin Swelling    Christopher Tyler is a 60 y.o. male presents with left groin pain and swelling x1 week.  Patient states that symptoms came on gradually and have been worsening.  Pain is intermittent, worse with walking and improved with laying down and resting.  He states the pain feels like burning and when he is experiencing he notes a "knot" in his left groin.  He also notes intermittent episodes of diarrhea over the past several weeks. No fevers, chills, chest pain, shortness of breath, nausea, vomiting, dysuria, hematuria, penile discharge, constipation.    HPI     Past Medical History:  Diagnosis Date   Ankle fracture, right    steel plate   Diabetes mellitus without complication (HCC)    High cholesterol    Hypertension     Patient Active Problem List   Diagnosis Date Noted   CAD (coronary artery disease) 12/17/2013   Carotid artery stenosis 12/17/2013   Chest pain 12/15/2013   Hyponatremia 12/15/2013   Confusion 12/15/2013   HYPERTENSION 05/21/2007    History reviewed. No pertinent surgical history.     No family history on file.  Social History   Tobacco Use   Smoking status: Every Day    Packs/day: 0.50    Types: Cigarettes   Smokeless tobacco: Never  Substance Use Topics   Alcohol use: Yes    Alcohol/week: 3.0 standard drinks    Types: 3 Cans of beer per week   Drug use: No    Home Medications Prior to Admission medications   Medication Sig Start Date End Date Taking? Authorizing Provider  aspirin EC 325 MG tablet Take 1 tablet (325 mg total) by mouth daily. 12/15/13  Yes Ghimire, Werner Lean, MD  atorvastatin (LIPITOR) 10 MG tablet Take 10 mg by mouth daily.   Yes [provider]  benazepril (LOTENSIN) 20 MG tablet Take 1 tablet (20 mg total) by mouth daily. 12/15/13  Yes Ghimire,  Werner Lean, MD  metFORMIN (GLUCOPHAGE-XR) 500 MG 24 hr tablet Take 1 tablet by mouth 2 (two) times daily.  11/27/13  Yes [provider]  Multiple Vitamin (MULTIVITAMIN WITH MINERALS) TABS tablet Take 1 tablet by mouth daily.   Yes [provider]  oxyCODONE (ROXICODONE) 5 MG immediate release tablet Take 1 tablet (5 mg total) by mouth every 4 (four) hours as needed for up to 3 days for severe pain. 07/03/21 07/06/21 Yes Judah Carchi T, PA-C  pantoprazole (PROTONIX) 40 MG tablet Take 1 tablet (40 mg total) by mouth daily. Switch for any other PPI at similar dose and frequency 12/15/13  Yes Ghimire, Werner Lean, MD  zolpidem (AMBIEN) 10 MG tablet Take 10 mg by mouth at bedtime as needed for sleep.   Yes [provider]  Fish Oil-Cholecalciferol (FISH OIL + D3) 1000-1000 MG-UNIT CAPS Take by mouth.    [provider]  Ginkgo Biloba (GINKOBA PO) Take by mouth.    [provider]    Allergies    Sulfa antibiotics  Review of Systems   Review of Systems  Constitutional:  Negative for chills and fever.  Respiratory:  Negative for shortness of breath.   Cardiovascular:  Negative for chest pain.  Gastrointestinal:  Positive for diarrhea. Negative for abdominal pain, constipation, nausea and vomiting.  Genitourinary:  Positive for scrotal  swelling and testicular pain. Negative for dysuria, flank pain, frequency, hematuria, penile discharge, penile pain and urgency.  All other systems reviewed and are negative.  Physical Exam Updated Vital Signs BP 127/88 (BP Location: Right Arm)   Pulse 66   Temp 98.5 F (36.9 C) (Oral)   Resp 16   SpO2 98%   Physical Exam Vitals and nursing note reviewed. Exam conducted with a chaperone present.  Constitutional:      Appearance: Normal appearance.  HENT:     Head: Normocephalic and atraumatic.  Eyes:     Conjunctiva/sclera: Conjunctivae normal.  Cardiovascular:     Rate and Rhythm: Normal rate and regular  rhythm.  Pulmonary:     Effort: Pulmonary effort is normal. No respiratory distress.     Breath sounds: Normal breath sounds.  Abdominal:     General: There is no distension.     Palpations: Abdomen is soft.     Tenderness: There is no abdominal tenderness.     Hernia: A hernia is present. Hernia is present in the left inguinal area.  Genitourinary:    Penis: Normal and circumcised.      Testes: Normal.     Epididymis:     Right: Normal.     Left: Normal.  Skin:    General: Skin is warm and dry.  Neurological:     General: No focal deficit present.     Mental Status: He is alert.    ED Results / Procedures / Treatments   Labs (all labs ordered are listed, but only abnormal results are displayed) Labs Reviewed  BASIC METABOLIC PANEL - Abnormal; Notable for the following components:      Result Value   Glucose, Bld 128 (*)    All other components within normal limits  URINALYSIS, ROUTINE W REFLEX MICROSCOPIC - Abnormal; Notable for the following components:   Color, Urine COLORLESS (*)    Glucose, UA >1,000 (*)    All other components within normal limits  CBC WITH DIFFERENTIAL/PLATELET    EKG None  Radiology CT ABDOMEN PELVIS W CONTRAST  Result Date: 07/03/2021 CLINICAL DATA:  Abdominal pain.  Hernia suspected. EXAM: CT ABDOMEN AND PELVIS WITH CONTRAST TECHNIQUE: Multidetector CT imaging of the abdomen and pelvis was performed using the standard protocol following bolus administration of intravenous contrast. CONTRAST:  OMNIPAQUE IOHEXOL 300 MG/ML  SOLN COMPARISON:  CT abdomen 08/03/06 FINDINGS: Lower chest: No acute abnormality. Hepatobiliary: The hepatic parenchyma is mildly diffusely hypodense compared to the splenic parenchyma consistent with fatty infiltration. No focal liver abnormality. No gallstones, gallbladder wall thickening, or pericholecystic fluid. No biliary dilatation. Pancreas: Interval development of a slightly irregular fat density lesion within the  proximal pancreatic body measuring up to 1.9 cm. Normal pancreatic contour. No surrounding inflammatory changes. No main pancreatic ductal dilatation. Spleen: Normal in size without focal abnormality. Adrenals/Urinary Tract: No adrenal nodule bilaterally. Bilateral kidneys enhance symmetrically. No hydronephrosis. No hydroureter. Circumferential urinary bladder wall thickening likely due to under distension of the urinary bladder. Otherwise urinary bladder is unremarkable. There is no urothelial wall thickening and there are no filling defects in the opacified portions of the bilateral collecting systems or ureters. Stomach/Bowel: Stomach is within normal limits. No evidence of bowel wall thickening or dilatation. Appendix appears normal. Vascular/Lymphatic: No abdominal aorta or iliac aneurysm. At least moderate atherosclerotic plaque of the aorta and its branches. No abdominal, pelvic, or inguinal lymphadenopathy. Reproductive: Prostate is unremarkable. Other: No intraperitoneal free fluid. No intraperitoneal free gas.  No organized fluid collection. Musculoskeletal: Small fat containing left inguinal hernia. Densely sclerotic right pubic symphysis lesion likely represents a bone island. No suspicious lytic or blastic osseous lesions. No acute displaced fracture. Multilevel degenerative changes of the spine. IMPRESSION: 1. Small fat containing left inguinal hernia. 2. Mild hepatic steatosis. 3. Interval development of a slightly irregular fat density lesion within the proximal pancreatic body measuring up to 1.9 cm. Finding likely represents a pancreatic lipomatous lesion versus intercalation of fat. Recommend nonemergent MRI pancreatic protocol for further evaluation. When the patient is clinically stable and able to follow directions and hold their breath (preferably as an outpatient) further evaluation with dedicated abdominal MRI should be considered. 4.  Aortic Atherosclerosis (ICD10-I70.0). Electronically  Signed   By: Tish Frederickson M.D.   On: 07/03/2021 15:14    Procedures Procedures   Medications Ordered in ED Medications  iohexol (OMNIPAQUE) 300 MG/ML solution 100 mL (100 mLs Intravenous Contrast Given 07/03/21 1443)    ED Course  I have reviewed the triage vital signs and the nursing notes.  Pertinent labs & imaging results that were available during my care of the patient were reviewed by me and considered in my medical decision making (see chart for details).    MDM Rules/Calculators/A&P                           Patient is 60 y/o male who presents with left groin pain x 1 week. Pain is intermittent, worse with walking, improved with laying down. No fevers, chills, chest pain, shortness of breath, abdominal pain, nausea or vomiting, dysuria or hematuria. Some intermittent diarrhea for several weeks.   On exam he is afebrile and in no acute distress. Hernia palpated in left inguinal canal with tenderness. Able to be reduced. Normal testes and epididymis. No penile lesions or discharge. Abdomen soft, non-tender and non-distended. Low concern for infection.   Lab work and urinalysis unremarkable. CT abdomen/pelvis with small left fat containing hernia. He is not requiring admission or inpatient treatment for his symptoms at this time. Plan to discharge to home with pain medication and surgical follow up. Patient agreeable to plan.   Final Clinical Impression(s) / ED Diagnoses Final diagnoses:  Left inguinal hernia    Rx / DC Orders ED Discharge Orders          Ordered    oxyCODONE (ROXICODONE) 5 MG immediate release tablet  Every 4 hours PRN        07/03/21 1653             Miette Molenda T, PA-C 07/03/21 1657    Virgina Norfolk, DO 07/03/21 2011

## 2021-07-03 NOTE — ED Notes (Signed)
Attempted iv x1 unsuccessful right ac

## 2021-07-03 NOTE — Discharge Instructions (Addendum)
You were seen in the emergency department today for left groin swelling.  The CT of your pelvis did show that you have a hernia on the left side.  This is not something that is requiring hospitalization or surgery today, but I do recommend following up with the surgery clinic to discuss possible correction of this. I've attached their contact information for you to make an appointment.   I am prescribing you a short course of oxycodone for pain. I'd like you to take this only for severe pain, and you can take tylenol with this as well. Do not drink alcohol or operating machinery while taking this medication.  The CT also commented on a suspicious lesion on your pancreas and the radiologist recommended getting an MRI. I recommend you follow up with your primary doctor about this to schedule the scan.   Continue to monitor how you're doing and return to the ER for new or worsening symptoms such as worsening pain or swelling that is not going away. It has been a pleasure seeing and caring for you today and I hope you start feeling better soon.

## 2021-07-03 NOTE — ED Notes (Signed)
Patient transported to CT 

## 2021-07-03 NOTE — ED Triage Notes (Addendum)
Pt reports a lump in his left groin, painful when walking. Noticed it about a week ago. No recent illness.

## 2021-08-03 ENCOUNTER — Encounter (HOSPITAL_COMMUNITY): Payer: Self-pay | Admitting: Radiology

## 2021-10-01 ENCOUNTER — Other Ambulatory Visit: Payer: Self-pay | Admitting: Family Medicine

## 2021-10-01 DIAGNOSIS — K869 Disease of pancreas, unspecified: Secondary | ICD-10-CM

## 2021-11-24 ENCOUNTER — Ambulatory Visit
Admission: RE | Admit: 2021-11-24 | Discharge: 2021-11-24 | Disposition: A | Discharge status: Home / Self Care | Payer: BC Managed Care – PPO | Source: Ambulatory Visit | Attending: Family Medicine | Admitting: Family Medicine

## 2021-11-24 DIAGNOSIS — K869 Disease of pancreas, unspecified: Secondary | ICD-10-CM

## 2021-11-24 MED ORDER — GADOBENATE DIMEGLUMINE 529 MG/ML IV SOLN
15.0000 mL | Freq: Once | INTRAVENOUS | Status: AC | PRN
  Administered 2021-11-24: 15 mL via INTRAVENOUS

## 2023-06-13 ENCOUNTER — Emergency Department (HOSPITAL_BASED_OUTPATIENT_CLINIC_OR_DEPARTMENT_OTHER)
Admission: EM | Admit: 2023-06-13 | Discharge: 2023-06-13 | Disposition: A | Discharge status: Home / Self Care | Payer: BC Managed Care – PPO | Attending: Emergency Medicine | Admitting: Emergency Medicine

## 2023-06-13 ENCOUNTER — Emergency Department (HOSPITAL_BASED_OUTPATIENT_CLINIC_OR_DEPARTMENT_OTHER): Payer: BC Managed Care – PPO

## 2023-06-13 ENCOUNTER — Encounter (HOSPITAL_BASED_OUTPATIENT_CLINIC_OR_DEPARTMENT_OTHER): Payer: Self-pay | Admitting: Emergency Medicine

## 2023-06-13 ENCOUNTER — Other Ambulatory Visit (HOSPITAL_BASED_OUTPATIENT_CLINIC_OR_DEPARTMENT_OTHER): Payer: Self-pay

## 2023-06-13 DIAGNOSIS — Z79899 Other long term (current) drug therapy: Secondary | ICD-10-CM | POA: Diagnosis not present

## 2023-06-13 DIAGNOSIS — Y92009 Unspecified place in unspecified non-institutional (private) residence as the place of occurrence of the external cause: Secondary | ICD-10-CM | POA: Diagnosis not present

## 2023-06-13 DIAGNOSIS — S01412A Laceration without foreign body of left cheek and temporomandibular area, initial encounter: Secondary | ICD-10-CM | POA: Insufficient documentation

## 2023-06-13 DIAGNOSIS — S0181XA Laceration without foreign body of other part of head, initial encounter: Secondary | ICD-10-CM

## 2023-06-13 DIAGNOSIS — Z7982 Long term (current) use of aspirin: Secondary | ICD-10-CM | POA: Insufficient documentation

## 2023-06-13 DIAGNOSIS — S0993XA Unspecified injury of face, initial encounter: Secondary | ICD-10-CM | POA: Diagnosis present

## 2023-06-13 DIAGNOSIS — W108XXA Fall (on) (from) other stairs and steps, initial encounter: Secondary | ICD-10-CM

## 2023-06-13 DIAGNOSIS — Z7984 Long term (current) use of oral hypoglycemic drugs: Secondary | ICD-10-CM | POA: Diagnosis not present

## 2023-06-13 DIAGNOSIS — M79645 Pain in left finger(s): Secondary | ICD-10-CM | POA: Diagnosis not present

## 2023-06-13 NOTE — ED Provider Notes (Signed)
[place] EMERGENCY DEPARTMENT AT [facility] Provider Note   CSN: [id number] Arrival date & time: 06/13/23  4098     History  Chief Complaint  Patient presents with   Christopher Tyler is a 62 y.o. male here after fall down about 6 steps that 1 AM at home when he was going downstairs to get something to eat.  He went back to bed afterward.  Came to the ED for concern for his bruising, laceration on his face.  Says his left thumb has pain and swelling as well.  Denies any presyncopal or syncopal symptoms. He says he intermittently takes aspirin 81 mg daily.  Denies history of CVA or MI.   No shortness of breath or chest pain. No headache, eye pain or vision changes. Denies any other concerns.  Says he has had a couple falls in the past year, with similar mechanism of injury.  He works as Economist.   Fall The current episode started 6 to 12 hours ago. Pertinent negatives include no chest pain and no headaches.       Home Medications Prior to Admission medications   Medication Sig Start Date End Date Taking? Authorizing Provider  amLODipine (NORVASC) 2.5 MG tablet Take 2.5 mg by mouth daily. 04/29/21  Yes [provider]  ipratropium (ATROVENT) 0.06 % nasal spray Place 2 sprays into both nostrils 3 (three) times daily. 05/28/23  Yes [provider]  JARDIANCE 25 MG TABS tablet Take 1 tablet by mouth daily. 05/17/21  Yes [provider]  RYBELSUS 7 MG TABS Take 1 tablet by mouth every morning. 06/01/23  Yes [provider]  aspirin EC 325 MG tablet Take 1 tablet (325 mg total) by mouth daily. 12/15/13   Ghimire, Werner Lean, MD  atorvastatin (LIPITOR) 10 MG tablet Take 10 mg by mouth daily.    [provider]  benazepril (LOTENSIN) 20 MG tablet Take 1 tablet (20 mg total) by mouth daily. 12/15/13   Ghimire, Werner Lean, MD  Fish Oil-Cholecalciferol (FISH OIL + D3) 1000-1000 MG-UNIT CAPS Take by mouth.     [provider]  Ginkgo Biloba (GINKOBA PO) Take by mouth.    [provider]  metFORMIN (GLUCOPHAGE-XR) 500 MG 24 hr tablet Take 1 tablet by mouth 2 (two) times daily.  11/27/13   [provider]  Multiple Vitamin (MULTIVITAMIN WITH MINERALS) TABS tablet Take 1 tablet by mouth daily.    [provider]  pantoprazole (PROTONIX) 40 MG tablet Take 1 tablet (40 mg total) by mouth daily. Switch for any other PPI at similar dose and frequency 12/15/13   Ghimire, Werner Lean, MD  zolpidem (AMBIEN) 10 MG tablet Take 10 mg by mouth at bedtime as needed for sleep.    [provider]      Allergies    Sulfa antibiotics    Review of Systems   Review of Systems  Constitutional:  Negative for chills and fever.  Eyes:  Negative for pain and visual disturbance.  Cardiovascular:  Negative for chest pain.  Skin:  Positive for wound.  Neurological:  Negative for dizziness, light-headedness and headaches.  Psychiatric/Behavioral:  Negative for confusion.     Physical Exam Updated Vital Signs BP (!) 155/95   Pulse 95   Temp 98.5 F (36.9 C) (Oral)   Ht 5\' 7"  (1.702 m)   Wt 79.4 kg   SpO2 96%   BMI 27.41 kg/m  Physical Exam Constitutional:  Appearance: Normal appearance.  HENT:     Head: Normocephalic.     Nose: Nose normal.     Mouth/Throat:     Mouth: Mucous membranes are moist.     Pharynx: Oropharynx is clear.  Eyes:     Extraocular Movements: Extraocular movements intact.     Pupils: Pupils are equal, round, and reactive to light.  Neck:     Comments: No midline spinal tenderness Cardiovascular:     Rate and Rhythm: Normal rate and regular rhythm.  Pulmonary:     Effort: Pulmonary effort is normal.     Breath sounds: Normal breath sounds.  Musculoskeletal:     Cervical back: Normal range of motion. No rigidity.  Skin:    General: Skin is warm and dry.     Findings: Bruising (Over left cheek with superficial laceration; hemostatic)  present.  Neurological:     General: No focal deficit present.     Mental Status: He is alert and oriented to person, place, and time.     ED Results / Procedures / Treatments   Labs (all labs ordered are listed, but only abnormal results are displayed) Labs Reviewed - No data to display  EKG None  Radiology DG Hand Complete Left  Result Date: 06/13/2023 CLINICAL DATA:  Fall.  Injury over thumb. EXAM: LEFT HAND - COMPLETE 3+ VIEW COMPARISON:  None Available. FINDINGS: No acute fracture or dislocation. No aggressive osseous lesion. There are minimal diffuse arthritic changes of imaged joints. No radiopaque foreign bodies. Soft tissues are within normal limits. IMPRESSION: 1. Minimal degenerative change. No acute fracture or dislocation. Electronically Signed   By: Jules Schick M.D.   On: 06/13/2023 09:13    Procedures Procedures    Medications Ordered in ED Medications - No data to display  ED Course/ Medical Decision Making/ A&P                                 Medical Decision Making 34 old male arrived after fall at home down 6 steps. A&O x 4 with no focal neurological deficits.  No concerning physical exam findings such as midline spinal tenderness, bony deformity.  Per [place] CT head injury/trauma rule, did not obtain CT head today.  Did not have a "dangerous" mechanism. Left facial laceration/abrasion over cheek without any vision changes or globe abnormalities.   Left hand x-ray negative for fracture.  Thumb spica splint placed for left thumb pain. Should follow-up with PCP to discuss frequent falls and preventative measures.  Return ER precautions discussed.  Amount and/or Complexity of Data Reviewed Radiology: ordered.         Final Clinical Impression(s) / ED Diagnoses Final diagnoses:  Fall (on) (from) other stairs and steps, initial encounter  Facial laceration, initial encounter  Thumb pain, left    Rx / DC Orders ED Discharge Orders     None          Darral Dash, DO 06/13/23 1610    Ernie Avena, MD 06/13/23 [id number]

## 2023-06-13 NOTE — ED Notes (Signed)
Pt given discharge instructions. Opportunities given for questions. Pt verbalizes understanding. Bacitracin given.  Jillyn Hidden, RN

## 2023-06-13 NOTE — Discharge Instructions (Signed)
It was a pleasure caring for you today in the emergency department.  Your x ray was normal- you do not have any broken bones. For your bruise/laceration on your face, use ice and apply topical antibiotic ointment.  Please return to the emergency department for any worsening or worrisome symptoms.

## 2023-06-13 NOTE — ED Triage Notes (Signed)
Pt arrived from home for fall down the stairs at 0100 today. States he hit his head and hurt his thumb and forearm. No LOC. No blood thinners, daily aspirin. C/o neck also being sore. Bruising to L eye and temporal, abrasion to L eye. Swelling on L forearm. A/ox4, VSS.

## 2023-08-03 IMAGING — MR MR ABDOMEN WO/W CM MRCP
13 of 20 series · 26 of 48 positions shown · IV contrast (multihance)
Comparison: CT abdomen and pelvis 07/03/2021

CLINICAL DATA: Pancreatic lesion follow-up

EXAM:
MRI ABDOMEN WITHOUT AND WITH CONTRAST (INCLUDING MRCP)
TECHNIQUE: Multiplanar multisequence MR imaging of the abdomen was performed
both before and after the administration of intravenous contrast.
Heavily T2-weighted images of the biliary and pancreatic ducts were
obtained, and three-dimensional MRCP images were rendered by post
processing.
CONTRAST:  15mL MULTIHANCE GADOBENATE DIMEGLUMINE 529 MG/ML IV SOLN

[Series 3: cor haste · coronal · 5.0mm · 0.74mm/px · 2 of 41 slices shown]
[im 1/41]
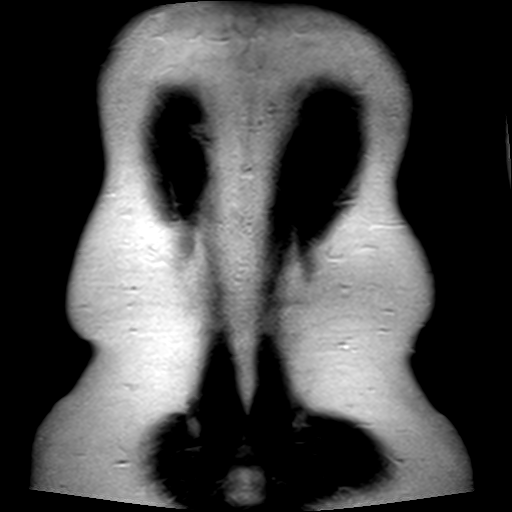
[im 41/41]
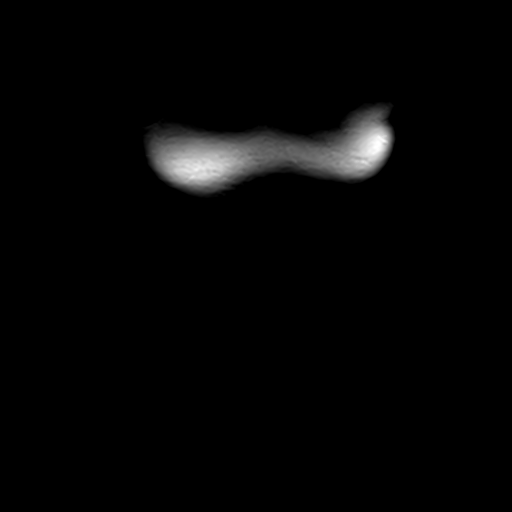

[Series 4: axial haste · axial · 6.0mm · 0.78mm/px · z∈[-90,+134]mm · 2 of 35 slices shown]
[im 1/35]
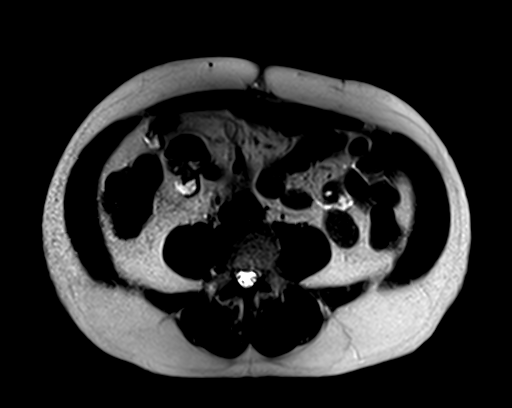
[im 35/35]
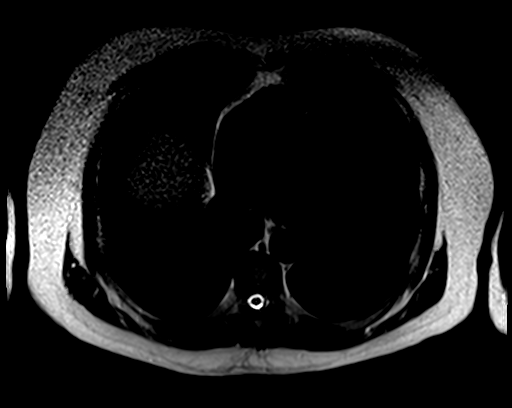

[Series 5: T2 fat-sat · axial · 6.0mm · 1.09mm/px · 1 of 36 slices shown]
[im 1/36]
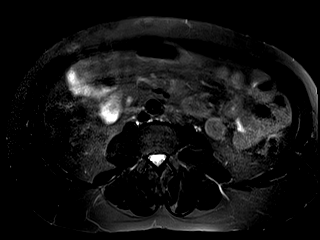

[Series 6: ep2d_[id number]_trig · axial · 6.0mm · 2.03mm/px · z∈[-57,+195]mm · 3 of 108 slices shown]
[im 1/108]
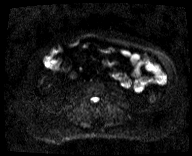
[im 54/108]
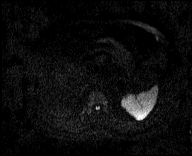
[im 108/108]
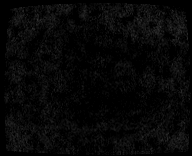

[Series 7: ep2d_[id number]_p2_trig_adc · axial · 6.0mm · 2.03mm/px · 1 of 36 slices shown]
[im 1/36]
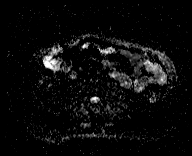

[Series 12: bSSFP · axial · 4.0mm · 0.72mm/px · z∈[-74,+166]mm · 2 of 61 slices shown (1 of 2)]
[im 1/61]
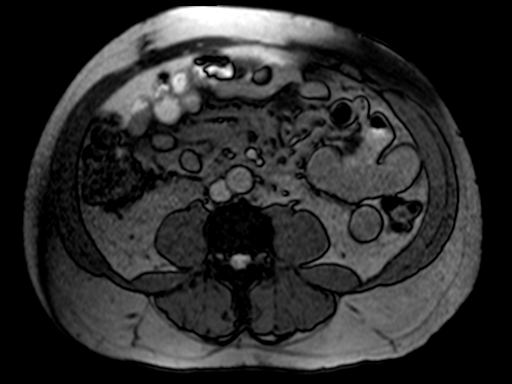
[im 61/61]
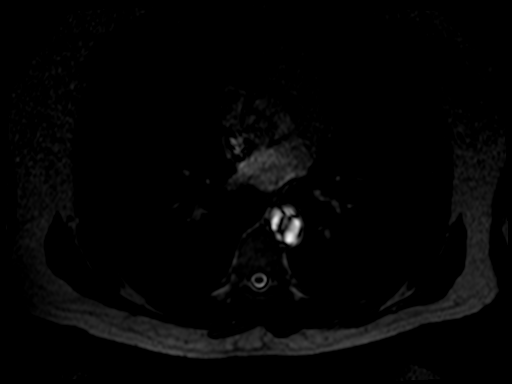

[Series 13: bSSFP · coronal · 5.0mm · 0.78mm/px · 1 of 34 slices shown (2 of 2)]
[im 1/34]
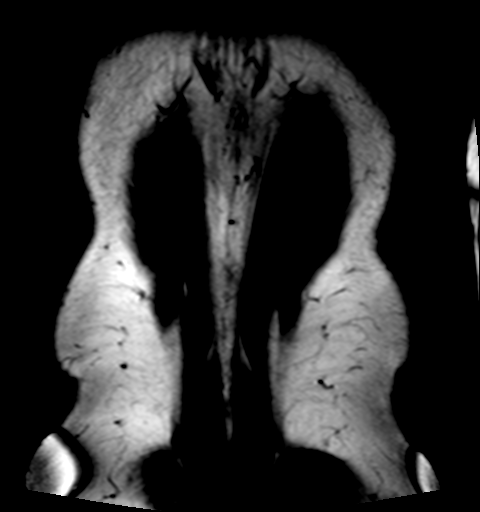

[Series 14: T2 · coronal · 3.0mm · 0.70mm/px · 2 of 56 slices shown]
[im 1/56]
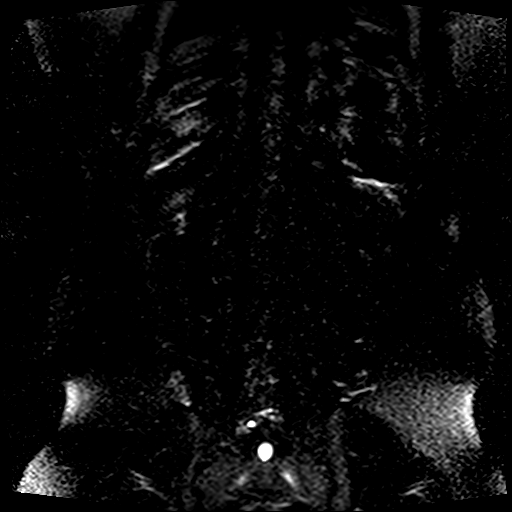
[im 56/56]
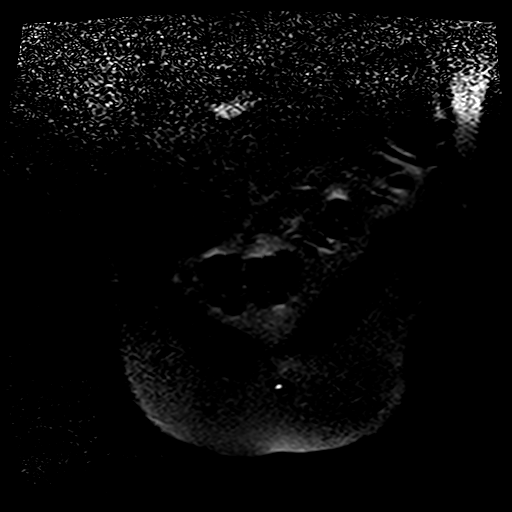

[Series 15: T1 · axial · 6.0mm · 0.74mm/px · z∈[-69,+162]mm · 2 of 72 slices shown]
[im 1/72]
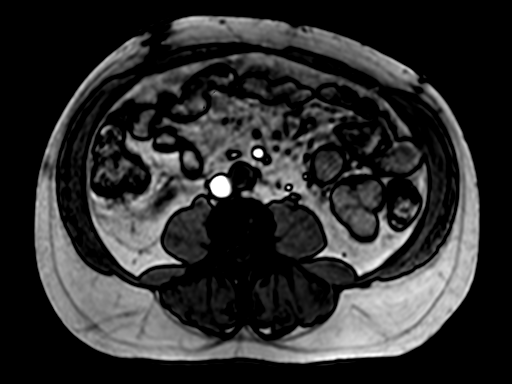
[im 72/72]
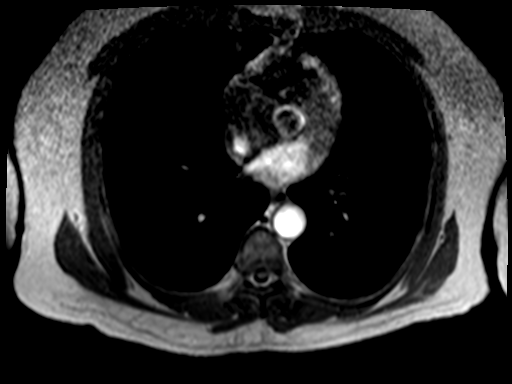

[Series 16: T1 dynamic · axial · non-contrast · 2.5mm · 0.74mm/px · z∈[-97,+140]mm · 3 of 96 slices shown]
[im 1/96]
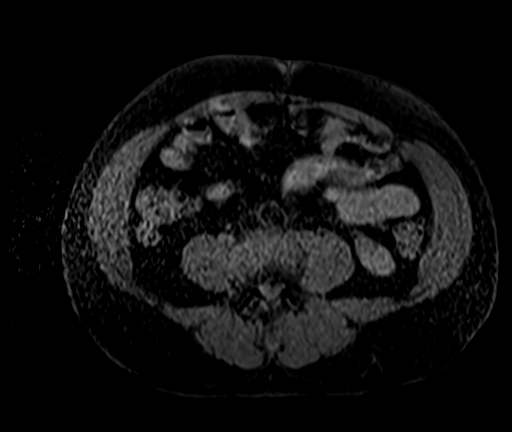
[im 48/96]
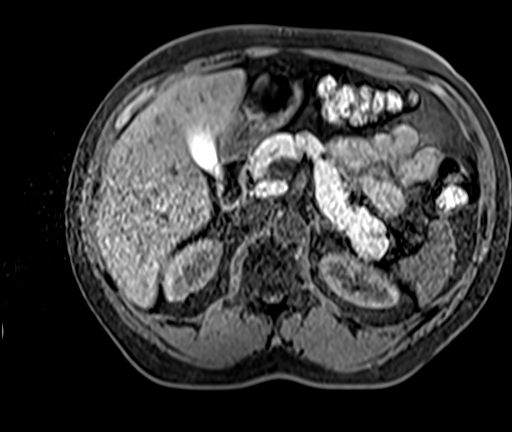
[im 96/96]
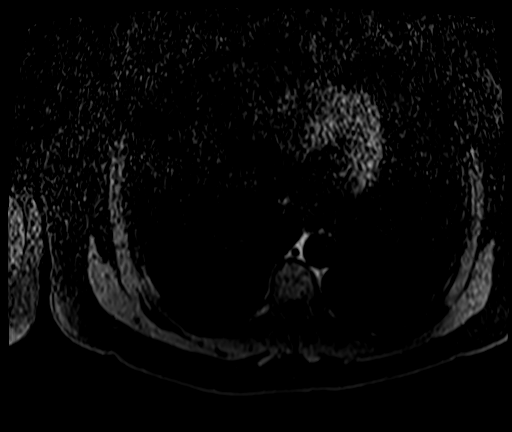

[Series 17: T1 dynamic post-contrast · axial · 2.5mm · 0.74mm/px · z∈[-97,+140]mm · 3 of 96 slices shown (1 of 3)]
[im 1/96]
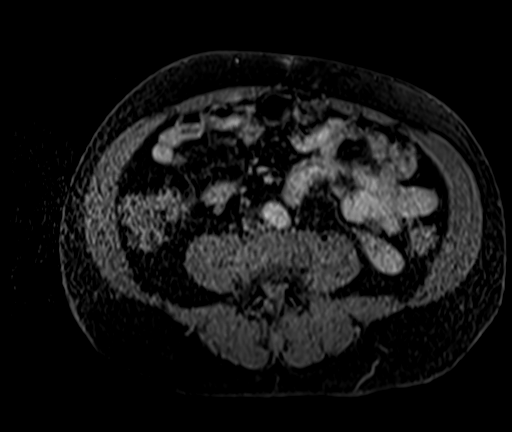
[im 48/96]
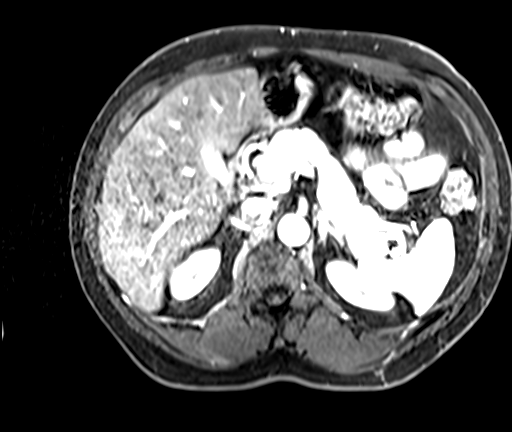
[im 96/96]
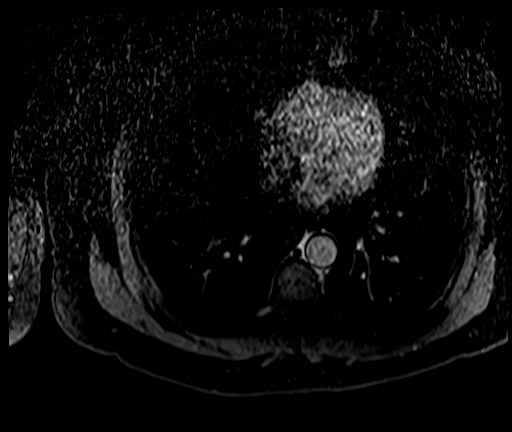

[Series 18: T1 dynamic post-contrast · axial · 2.5mm · 0.74mm/px · z∈[-97,+140]mm · 3 of 96 slices shown (2 of 3)]
[im 1/96]
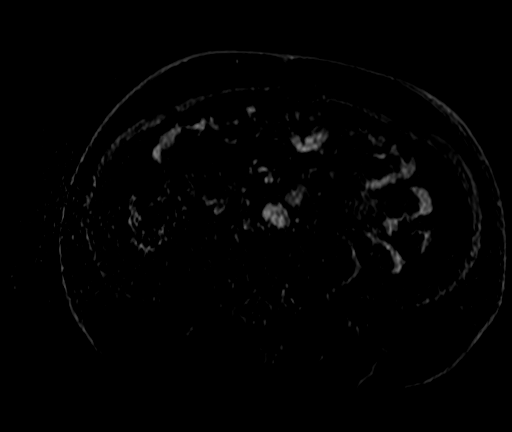
[im 48/96]
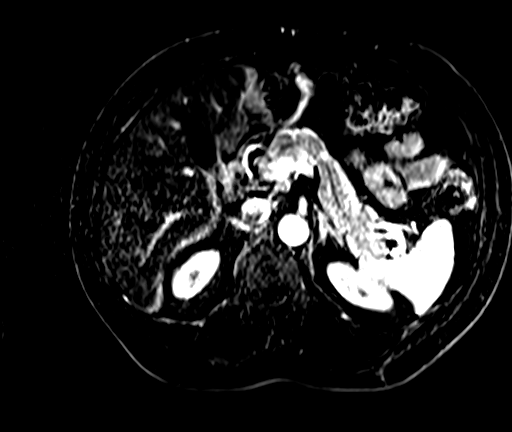
[im 96/96]
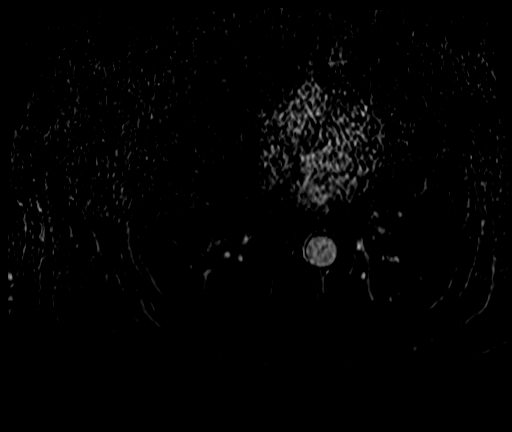

[Series 19: T1 dynamic post-contrast · axial · 2.5mm · 0.74mm/px · 1 of 96 slices shown (3 of 3)]
[im 1/96]
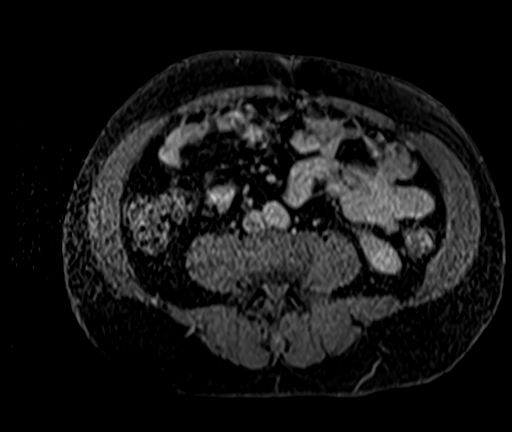

[26 of 48 positions shown; findings below may reference images not displayed]

FINDINGS: Lower chest: No acute findings.

Hepatobiliary: Liver is normal in size and contour with no
suspicious mass identified. Diffuse hepatic steatosis. Gallbladder
appears normal. No biliary ductal dilatation identified.

Pancreas: 1.7 x 1 x 1.6 cm fatty focus identified at the superior
aspect of the pancreas with no suspicious enhancement or nodularity.
No other pancreatic lesions identified. Pancreatic duct is normal
caliber.

Spleen:  Within normal limits in size and appearance.

Adrenals/Urinary Tract: No masses identified. No evidence of
hydronephrosis.

Stomach/Bowel: Visualized portions within the abdomen are
unremarkable.

Vascular/Lymphatic: No pathologically enlarged lymph nodes
identified. No abdominal aortic aneurysm demonstrated.

Other:  None.

Musculoskeletal: No suspicious bone lesions identified.
IMPRESSION: 1. Benign-appearing focus of fat in the body of the pancreas,
possibly a lipoma or interdigitating fat. No suspicious features.
2. Hepatic steatosis.

## 2024-06-10 DIAGNOSIS — J019 Acute sinusitis, unspecified: Secondary | ICD-10-CM | POA: Diagnosis not present

## 2024-06-24 DIAGNOSIS — E7800 Pure hypercholesterolemia, unspecified: Secondary | ICD-10-CM | POA: Diagnosis not present

## 2024-06-24 DIAGNOSIS — E1169 Type 2 diabetes mellitus with other specified complication: Secondary | ICD-10-CM | POA: Diagnosis not present

## 2024-06-24 DIAGNOSIS — G4700 Insomnia, unspecified: Secondary | ICD-10-CM | POA: Diagnosis not present

## 2024-06-24 DIAGNOSIS — Z23 Encounter for immunization: Secondary | ICD-10-CM | POA: Diagnosis not present

## 2024-06-24 DIAGNOSIS — G4733 Obstructive sleep apnea (adult) (pediatric): Secondary | ICD-10-CM | POA: Diagnosis not present

## 2024-06-24 DIAGNOSIS — I10 Essential (primary) hypertension: Secondary | ICD-10-CM | POA: Diagnosis not present

## 2024-06-24 DIAGNOSIS — K219 Gastro-esophageal reflux disease without esophagitis: Secondary | ICD-10-CM | POA: Diagnosis not present

## 2024-06-28 DIAGNOSIS — M51379 Other intervertebral disc degeneration, lumbosacral region without mention of lumbar back pain or lower extremity pain: Secondary | ICD-10-CM | POA: Diagnosis not present

## 2024-06-28 DIAGNOSIS — M25551 Pain in right hip: Secondary | ICD-10-CM | POA: Diagnosis not present

## 2024-06-28 DIAGNOSIS — M545 Low back pain, unspecified: Secondary | ICD-10-CM | POA: Diagnosis not present

## 2024-07-02 DIAGNOSIS — M65312 Trigger thumb, left thumb: Secondary | ICD-10-CM | POA: Diagnosis not present

## 2024-07-08 DIAGNOSIS — M5459 Other low back pain: Secondary | ICD-10-CM | POA: Diagnosis not present

## 2024-07-17 DIAGNOSIS — M5459 Other low back pain: Secondary | ICD-10-CM | POA: Diagnosis not present

## 2024-07-24 DIAGNOSIS — M5459 Other low back pain: Secondary | ICD-10-CM | POA: Diagnosis not present

## 2024-07-25 DIAGNOSIS — Z09 Encounter for follow-up examination after completed treatment for conditions other than malignant neoplasm: Secondary | ICD-10-CM | POA: Diagnosis not present

## 2024-07-25 DIAGNOSIS — Z860101 Personal history of adenomatous and serrated colon polyps: Secondary | ICD-10-CM | POA: Diagnosis not present

## 2024-07-25 DIAGNOSIS — K6389 Other specified diseases of intestine: Secondary | ICD-10-CM | POA: Diagnosis not present

## 2024-07-30 DIAGNOSIS — B078 Other viral warts: Secondary | ICD-10-CM | POA: Diagnosis not present

## 2024-07-31 DIAGNOSIS — M5459 Other low back pain: Secondary | ICD-10-CM | POA: Diagnosis not present

## 2024-08-13 DIAGNOSIS — M65312 Trigger thumb, left thumb: Secondary | ICD-10-CM | POA: Diagnosis not present

## 2024-09-05 DIAGNOSIS — R051 Acute cough: Secondary | ICD-10-CM | POA: Diagnosis not present

## 2024-09-05 DIAGNOSIS — J0140 Acute pansinusitis, unspecified: Secondary | ICD-10-CM | POA: Diagnosis not present

## 2024-09-05 DIAGNOSIS — J3489 Other specified disorders of nose and nasal sinuses: Secondary | ICD-10-CM | POA: Diagnosis not present

## 2024-11-06 ENCOUNTER — Ambulatory Visit: Admitting: Podiatry
